# Patient Record
Sex: Female | Born: 1995 | Race: Black or African American | Hispanic: No | Marital: Married | Smoking: Never smoker
Health system: Southern US, Community
[De-identification: ages and names within clinical notes are randomized; demographics above are authoritative.]

## PROBLEM LIST (undated history)

## (undated) DIAGNOSIS — Z789 Other specified health status: Secondary | ICD-10-CM

## (undated) DIAGNOSIS — O1490 Unspecified pre-eclampsia, unspecified trimester: Secondary | ICD-10-CM

## (undated) DIAGNOSIS — I10 Essential (primary) hypertension: Secondary | ICD-10-CM

## (undated) DIAGNOSIS — B029 Zoster without complications: Secondary | ICD-10-CM

## (undated) HISTORY — PX: APPENDECTOMY: SHX54

## (undated) HISTORY — DX: Essential (primary) hypertension: I10

## (undated) HISTORY — DX: Zoster without complications: B02.9

---
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
## 2016-12-16 ENCOUNTER — Encounter (HOSPITAL_COMMUNITY): Payer: Self-pay | Admitting: Emergency Medicine

## 2016-12-16 DIAGNOSIS — N6452 Nipple discharge: Secondary | ICD-10-CM | POA: Insufficient documentation

## 2016-12-16 DIAGNOSIS — Z87891 Personal history of nicotine dependence: Secondary | ICD-10-CM | POA: Insufficient documentation

## 2016-12-16 NOTE — ED Triage Notes (Signed)
Pt st's she woke up this am with soreness to right breast.  St's tonight while at work she had bleeding coming from right nipple.

## 2016-12-17 ENCOUNTER — Emergency Department (HOSPITAL_COMMUNITY)
Admission: EM | Admit: 2016-12-17 | Discharge: 2016-12-17 | Disposition: A | Discharge status: Home / Self Care | Payer: Self-pay | Attending: Emergency Medicine | Admitting: Emergency Medicine

## 2016-12-17 ENCOUNTER — Encounter: Payer: Self-pay | Admitting: Emergency Medicine

## 2016-12-17 DIAGNOSIS — N6459 Other signs and symptoms in breast: Secondary | ICD-10-CM

## 2016-12-17 LAB — POC URINE PREG, ED: Preg Test, Ur: NEGATIVE

## 2016-12-17 MED ORDER — IBUPROFEN 600 MG PO TABS: 600.0000 mg | ORAL_TABLET | Freq: Four times a day (QID) | ORAL | 0 refills | Status: DC | PRN

## 2016-12-17 MED ORDER — BACITRACIN ZINC 500 UNIT/GM EX OINT: 1.0000 "application " | TOPICAL_OINTMENT | Freq: Two times a day (BID) | CUTANEOUS | 0 refills | Status: DC

## 2016-12-17 NOTE — ED Provider Notes (Signed)
[facility] Provider Note   CSN: [id number] Arrival date & time: 12/16/16  2349    History   Chief Complaint Chief Complaint  Patient presents with  . Breast Problem    HPI Erika Christian is a 21 y.o. female.  21 year old female presents to the emergency department for evaluation of right breast soreness. She states that soreness began on waking this morning. It is worse with palpation to the area. She has also had a small amount of bleeding from her nipple. She denies any purulent drainage. No soreness anywhere else on the breast. Patient denies any recent trauma. No new soaps or lotions. No associated fever or history of breast abscess. No medications taken prior to arrival for symptoms.     History reviewed. No pertinent past medical history.  There are no active problems to display for this patient.   Past Surgical History:  Procedure Laterality Date  . APPENDECTOMY      OB History    No data available       Home Medications    Prior to Admission medications   Medication Sig Start Date End Date Taking? Authorizing Provider  bacitracin ointment Apply 1 application topically 2 (two) times daily. 12/17/16   Antony MaduraKelly Shaddai Shapley, PA-C  ibuprofen (ADVIL,MOTRIN) 600 MG tablet Take 1 tablet (600 mg total) by mouth every 6 (six) hours as needed. 12/17/16   Antony MaduraKelly Dinero Chavira, PA-C    Family History No family history on file.  Social History Social History  Substance Use Topics  . Smoking status: Former Games developermoker  . Smokeless tobacco: Never Used  . Alcohol use Yes     Allergies   Patient has no allergy information on record.   Review of Systems Review of Systems Ten systems reviewed and are negative for acute change, except as noted in the HPI.    Physical Exam Updated Vital Signs BP 113/72   Pulse 72   Temp 98.6 F (37 C) (Oral)   Resp 16   Ht 4\' 8"  (1.422 m)   Wt 76.2 kg   LMP 12/09/2016 (Exact Date)   SpO2 100%   BMI 37.66 kg/m   Physical Exam    Constitutional: She is oriented to person, place, and time. She appears well-developed and well-nourished. No distress.  Nontoxic and in NAD  HENT:  Head: Normocephalic and atraumatic.  Eyes: Conjunctivae and EOM are normal. No scleral icterus.  Neck: Normal range of motion.  Cardiovascular: Normal rate, regular rhythm and intact distal pulses.   Pulmonary/Chest: Effort normal. No respiratory distress.  Respirations even and unlabored. There is soreness to the nipple and areola. Skin appears cracked with scant bleeding. No blood expelled from nipple with palpation. No underlying induration. No erythema or heat to touch. No other associated breast tenderness. No palpable masses.  Musculoskeletal: Normal range of motion.  Neurological: She is alert and oriented to person, place, and time. She exhibits normal muscle tone. Coordination normal.  Skin: Skin is warm and dry. No rash noted. She is not diaphoretic. No erythema. No pallor.  Psychiatric: She has a normal mood and affect. Her behavior is normal.  Nursing note and vitals reviewed.    ED Treatments / Results  Labs (all labs ordered are listed, but only abnormal results are displayed) Labs Reviewed  POC URINE PREG, ED    EKG  EKG Interpretation None       Radiology No results found.  Procedures Procedures (including critical care time)  Medications Ordered in ED Medications -  No data to display   Initial Impression / Assessment and Plan / ED Course  I have reviewed the triage vital signs and the nursing notes.  Pertinent labs & imaging results that were available during my care of the patient were reviewed by me and considered in my medical decision making (see chart for details).     21 year old female presents to the emergency department for evaluation of bleeding from her right foot. There appears to be cracking in the skin of the right nipple. No associated induration, though the area is sore to touch. Not able  to expel any blood from the area. No evidence of mastitis or abscess. Pregnancy negative. Plan to manage supportively and refer to general surgery who can send the [facility] if [facility]. I have also recommended OB/GYN follow-up. Return precautions discussed and provided. Patient discharged in stable condition with no unaddressed concerns.   Final Clinical Impressions(s) / ED Diagnoses   Final diagnoses:  Bleeding from nipple in female    New Prescriptions Discharge Medication List as of 12/17/2016  4:15 AM    START taking these medications   Details  bacitracin ointment Apply 1 application topically 2 (two) times daily., Starting Sun 12/17/2016, Print    ibuprofen (ADVIL,MOTRIN) 600 MG tablet Take 1 tablet (600 mg total) by mouth every 6 (six) hours as needed., Starting Sun 12/17/2016, Print         [place], PA-C 12/17/16 1610    Gilda Crease, MD 12/17/16 [phone]

## 2016-12-17 NOTE — Discharge Instructions (Signed)
Use bacitracin to the area once per day. You may take ibuprofen for pain. Keep the area covered with a bandage or gauze. [facility] and/or your OBGYN.

## 2017-01-17 ENCOUNTER — Other Ambulatory Visit (HOSPITAL_COMMUNITY): Payer: Self-pay | Admitting: *Deleted

## 2017-01-17 DIAGNOSIS — N6452 Nipple discharge: Secondary | ICD-10-CM

## 2017-01-18 ENCOUNTER — Other Ambulatory Visit: Payer: Medicaid Other

## 2017-01-18 ENCOUNTER — Ambulatory Visit (HOSPITAL_COMMUNITY): Payer: Medicaid Other

## 2017-02-17 ENCOUNTER — Ambulatory Visit (HOSPITAL_COMMUNITY)
Admission: EM | Admit: 2017-02-17 | Discharge: 2017-02-17 | Disposition: A | Discharge status: Home / Self Care | Payer: Self-pay | Attending: Family Medicine | Admitting: Family Medicine

## 2017-02-17 ENCOUNTER — Encounter (HOSPITAL_COMMUNITY): Payer: Self-pay | Admitting: *Deleted

## 2017-02-17 ENCOUNTER — Ambulatory Visit (INDEPENDENT_AMBULATORY_CARE_PROVIDER_SITE_OTHER): Payer: Self-pay

## 2017-02-17 DIAGNOSIS — R0600 Dyspnea, unspecified: Secondary | ICD-10-CM

## 2017-02-17 DIAGNOSIS — J3489 Other specified disorders of nose and nasal sinuses: Secondary | ICD-10-CM

## 2017-02-17 DIAGNOSIS — R0981 Nasal congestion: Secondary | ICD-10-CM

## 2017-02-17 DIAGNOSIS — R0602 Shortness of breath: Secondary | ICD-10-CM

## 2017-02-17 MED ORDER — ALBUTEROL SULFATE HFA 108 (90 BASE) MCG/ACT IN AERS: 2.0000 | INHALATION_SPRAY | RESPIRATORY_TRACT | 0 refills | Status: DC | PRN

## 2017-02-17 NOTE — ED Notes (Signed)
Starting HR 87 and O2 100% room air. PT ambulates down long hallway and back. Highest HR was 102 bpm and O2 sats stayed 99-100% on room air. On return to room PT reports she feels like she is not breathing well, her chest is uncomfortable. No obvious respiratory distress. Respiratory rate 16-20. Hayden Rasmussen, NP made aware.

## 2017-02-17 NOTE — ED Triage Notes (Signed)
C/O chills and congestion x approx 2 days.  Last night started to feel like "I can't breathe good" through nose nor mouth.  Denies asthma hx.  Denies any pain.

## 2017-02-17 NOTE — Discharge Instructions (Signed)
Physical exam was normal. Chest x-ray was normal. Vital signs and oxygen saturation were normal. It is not completely clear as to what is causing you to feel short of breath. Unable to hear lung sounds. Well due to not taking a deep breath that it is possible that he may have occult bronchospasm. We will prescribe an albuterol inhaler to try to see if that will help. For any worsening, new symptoms or problems recommend following up with your doctor or go to the emergency department.

## 2017-02-17 NOTE — ED Provider Notes (Signed)
CSN: [id number] Arrival date & time 02/17/17  1451 History   First MD Initiated Contact with Patient 02/17/17 1610     Chief Complaint  Patient presents with  . Shortness of Breath  . Chills   (Consider location/radiation/quality/duration/timing/severity/associated sxs/prior Treatment) 21 year old female states that she is having trouble breathing through her nose sometimes but not now, stuffy nose and probable breathing through her mouth. She feels like she cannot get enough air in her lungs. She may have a little sore throat right middle associated with PND but no cough. No fever.      History reviewed. No pertinent past medical history. Past Surgical History:  Procedure Laterality Date  . APPENDECTOMY     No family history on file. Social History  Substance Use Topics  . Smoking status: Never Smoker  . Smokeless tobacco: Never Used  . Alcohol use Yes     Comment: occasionally   OB History    No data available     Review of Systems  Constitutional: Positive for activity change and chills. Negative for fever.  HENT: Positive for congestion and sore throat.   Respiratory: Positive for shortness of breath. Negative for wheezing.   Cardiovascular: Positive for chest pain.  Gastrointestinal: Negative.   Neurological: Negative.   All other systems reviewed and are negative.   Allergies  Orange fruit [citrus]  Home Medications   Prior to Admission medications   Medication Sig Start Date End Date Taking? Authorizing Provider  albuterol (PROVENTIL HFA;VENTOLIN HFA) 108 (90 Base) MCG/ACT inhaler Inhale 2 puffs into the lungs every 4 (four) hours as needed for wheezing or shortness of breath. 02/17/17   Hayden Rasmussen, NP   Meds Ordered and Administered this Visit  Medications - No data to display  BP 129/84 (BP Location: Left Arm)   Pulse 98   Temp 98.6 F (37 C) (Oral)   Resp 14   LMP 02/04/2017 (Approximate)   SpO2 100%  No data found.   Physical Exam   Constitutional: She is oriented to person, place, and time. She appears well-developed and well-nourished. No distress.  Patient is sitting on the exam table smiling and talking and shows no signs of distress whatsoever. Able to speak in complete sentences without having to stop and take extra breaths. She has relaxed posturing. Breathing is even and nonlabored.  HENT:  Head: Normocephalic and atraumatic.  Bilateral TMs are mildly retracted. Oropharynx with minimal erythema and scant clear PND. Otherwise no swelling, no exudates or other abnormalities.  Eyes: Conjunctivae and EOM are normal.  Neck: Normal range of motion. Neck supple. No tracheal deviation present.  No masses or  swelling.  Cardiovascular: Normal rate, regular rhythm, normal heart sounds and intact distal pulses.   Pulmonary/Chest: Effort normal and breath sounds normal. No respiratory distress. She has no wheezes. She has no rales. She exhibits tenderness.  Patient able to breathe in and out rapidly, although poor effort and unable/unwilling to take deep breaths.  Lungs are clear. No stridor or abnormal sounds from the throat or chest.  Positive for chest wall pain and tenderness over the right upper sternal border. This reproduces the pain for which she complains when taking a deep breath.  Abdominal: Soft. There is no tenderness. There is no rebound.  Musculoskeletal: Normal range of motion. She exhibits no edema or tenderness.  Lymphadenopathy:    She has no cervical adenopathy.  Neurological: She is alert and oriented to person, place, and time. No  cranial nerve deficit.  Skin: Skin is warm and dry. No rash noted.  Nursing note and vitals reviewed.   Urgent Care Course     Procedures (including critical care time)  Labs Review Labs Reviewed - No data to display  Imaging Review Dg Chest 2 View  Result Date: 02/17/2017 CLINICAL DATA:  Per pt: SOB and sick for two days, chest pain. Patient pointed to the mid  sternum for pain, no injury, no fever. Non-smoker. No HBP. No history of cardiac disease or respiratory disease. Patient is not a diabetic EXAM: CHEST  2 VIEW COMPARISON:  None. FINDINGS: Midline trachea.  Normal heart size and mediastinal contours. Sharp costophrenic angles.  No pneumothorax.  Clear lungs. IMPRESSION: No active cardiopulmonary disease. Electronically Signed   By: Jeronimo Greaves M.D.   On: 02/17/2017 17:18     Visual Acuity Review  Right Eye Distance:   Left Eye Distance:   Bilateral Distance:    Right Eye Near:   Left Eye Near:    Bilateral Near:         MDM   1. Dyspnea, unspecified type   2. Nasal congestion with rhinorrhea     21 year old female with subjective dyspnea more so on exertion. Her exam is completely normal, with the exception of PND. Resting O2 sat 100%, walking down the hall and back 99-100%, pulse rate did go up from 87 to 100. Lungs are clear. No objective evidence of difficulty with breathing or shortness of breath. Chest x-ray is clear. No evidence of infection on x-ray. Suspect there may be a psychological component involved. There may be an occult bronchospasm that is not her on lung auscultation as the patient was unable or unwilling to take a deep breath. Physical exam was normal. Chest x-ray was normal. Vital signs and oxygen saturation were normal. It is not completely clear as to what is causing you to feel short of breath. Unable to hear lung sounds. Well due to not taking a deep breath that it is possible that he may have occult bronchospasm. We will prescribe an albuterol inhaler to try to see if that will help. For any worsening, new symptoms or problems recommend following up with your doctor or go to the emergency department. Discussed non sedating antihistamine.  Meds ordered this encounter  Medications  . albuterol (PROVENTIL HFA;VENTOLIN HFA) 108 (90 Base) MCG/ACT inhaler    Sig: Inhale 2 puffs into the lungs every 4 (four) hours as  needed for wheezing or shortness of breath.    Dispense:  1 Inhaler    Refill:  0    Order Specific Question:   Supervising Provider    Answer:   Lonia Blood       Hayden Rasmussen, NP 02/17/17 1755    Hayden Rasmussen, NP 02/17/17 1756

## 2018-02-26 ENCOUNTER — Encounter (HOSPITAL_COMMUNITY): Payer: Self-pay | Admitting: Emergency Medicine

## 2018-02-26 ENCOUNTER — Other Ambulatory Visit: Payer: Self-pay

## 2018-02-26 ENCOUNTER — Ambulatory Visit (HOSPITAL_COMMUNITY)
Admission: EM | Admit: 2018-02-26 | Discharge: 2018-02-26 | Disposition: A | Discharge status: Home / Self Care | Payer: Self-pay | Attending: Family Medicine | Admitting: Family Medicine

## 2018-02-26 DIAGNOSIS — R059 Cough, unspecified: Secondary | ICD-10-CM

## 2018-02-26 DIAGNOSIS — H66001 Acute suppurative otitis media without spontaneous rupture of ear drum, right ear: Secondary | ICD-10-CM

## 2018-02-26 DIAGNOSIS — R05 Cough: Secondary | ICD-10-CM

## 2018-02-26 MED ORDER — MELOXICAM 7.5 MG PO TABS: 7.5000 mg | ORAL_TABLET | Freq: Every day | ORAL | 0 refills | Status: DC

## 2018-02-26 MED ORDER — AMOXICILLIN-POT CLAVULANATE 875-125 MG PO TABS: 1.0000 | ORAL_TABLET | Freq: Two times a day (BID) | ORAL | 0 refills | Status: DC

## 2018-02-26 MED ORDER — ALBUTEROL SULFATE HFA 108 (90 BASE) MCG/ACT IN AERS: 2.0000 | INHALATION_SPRAY | RESPIRATORY_TRACT | 0 refills | Status: DC | PRN

## 2018-02-26 MED ORDER — HYDROCOD POLST-CPM POLST ER 10-8 MG/5ML PO SUER: 5.0000 mL | Freq: Every evening | ORAL | 0 refills | Status: DC | PRN

## 2018-02-26 MED ORDER — IPRATROPIUM BROMIDE 0.06 % NA SOLN: 2.0000 | Freq: Four times a day (QID) | NASAL | 0 refills | Status: DC

## 2018-02-26 MED ORDER — BENZONATATE 100 MG PO CAPS: 100.0000 mg | ORAL_CAPSULE | Freq: Three times a day (TID) | ORAL | 0 refills | Status: DC

## 2018-02-26 NOTE — ED Triage Notes (Signed)
C/o productive cough head and chest congestion, rhinitis and chills onset Friday

## 2018-02-26 NOTE — ED Provider Notes (Signed)
[facility]    CSN: [id number] Arrival date & time: 02/26/18  1031     History   Chief Complaint Chief Complaint  Patient presents with  . Cough    HPI Erika Christian is a 22 y.o. female.   22 year old female comes in for 5-day history of URI symptoms. Nasal congestion, rhinorrhea, productive cough, sore throat, right ear pain. Has had body aches and chills. Denies fevers. States without history of asthma, but has needed albuterol in the past, and has had times when she felt she could have used one. States has had painful coughing to the chest otc zyrtec, benadryl, sinus medicine without relief. Never smoker.      History reviewed. No pertinent past medical history.  There are no active problems to display for this patient.   Past Surgical History:  Procedure Laterality Date  . APPENDECTOMY      OB History   None      Home Medications    Prior to Admission medications   Medication Sig Start Date End Date Taking? Authorizing Provider  diphenhydramine-acetaminophen (TYLENOL PM) 25-500 MG TABS tablet Take 1 tablet by mouth at bedtime as needed.   Yes [provider]  albuterol (PROVENTIL HFA;VENTOLIN HFA) 108 (90 Base) MCG/ACT inhaler Inhale 2 puffs into the lungs every 4 (four) hours as needed for wheezing or shortness of breath. 02/26/18   Cathie Hoops, Derrich Gaby V, PA-C  amoxicillin-clavulanate (AUGMENTIN) 875-125 MG tablet Take 1 tablet by mouth every 12 (twelve) hours. 02/26/18   Cathie Hoops, Jonquil Stubbe V, PA-C  benzonatate (TESSALON) 100 MG capsule Take 1 capsule (100 mg total) by mouth every 8 (eight) hours. 02/26/18   Cathie Hoops, Lynnann Knudsen V, PA-C  chlorpheniramine-HYDROcodone (TUSSIONEX PENNKINETIC ER) 10-8 MG/5ML SUER Take 5 mLs by mouth at bedtime as needed for cough. 02/26/18   Cathie Hoops, Isidora Laham V, PA-C  ipratropium (ATROVENT) 0.06 % nasal spray Place 2 sprays into both nostrils 4 (four) times daily. 02/26/18   Cathie Hoops, Barbarajean Kinzler V, PA-C  meloxicam (MOBIC) 7.5 MG tablet Take 1 tablet (7.5 mg total) by  mouth daily. 02/26/18   Belinda Fisher, PA-C    Family History History reviewed. No pertinent family history.  Social History Social History   Tobacco Use  . Smoking status: Never Smoker  . Smokeless tobacco: Never Used  Substance Use Topics  . Alcohol use: Yes    Comment: occasionally  . Drug use: No     Allergies   Orange fruit [citrus]   Review of Systems Review of Systems  Reason unable to perform ROS: See HPI as above.     Physical Exam Triage Vital Signs ED Triage Vitals  Enc Vitals Group     BP 02/26/18 1141 128/76     Pulse Rate 02/26/18 1141 90     Resp --      Temp 02/26/18 1141 98.1 F (36.7 C)     Temp Source 02/26/18 1141 Oral     SpO2 02/26/18 1141 100 %     Weight --      Height --      Head Circumference --      Peak Flow --      Pain Score 02/26/18 1138 9     Pain Loc --      Pain Edu? --      Excl. in GC? --    No data found.  Updated Vital Signs BP 128/76 (BP Location: Left Arm)   Pulse 90   Temp 98.1 F (36.7  C) (Oral)   SpO2 100%   Physical Exam  Constitutional: She is oriented to person, place, and time. She appears well-developed and well-nourished. No distress.  HENT:  Head: Normocephalic and atraumatic.  Right Ear: External ear and ear canal normal. Tympanic membrane is erythematous and bulging.  Left Ear: Tympanic membrane, external ear and ear canal normal. Tympanic membrane is not erythematous and not bulging.  Nose: Nose normal. Right sinus exhibits no maxillary sinus tenderness and no frontal sinus tenderness. Left sinus exhibits no maxillary sinus tenderness and no frontal sinus tenderness.  Mouth/Throat: Uvula is midline, oropharynx is clear and moist and mucous membranes are normal.  Eyes: Pupils are equal, round, and reactive to light. Conjunctivae are normal.  Neck: Normal range of motion. Neck supple.  Cardiovascular: Normal rate, regular rhythm and normal heart sounds. Exam reveals no gallop and no friction rub.  No  murmur heard. Pulmonary/Chest: Effort normal and breath sounds normal. No stridor. No respiratory distress. She has no decreased breath sounds. She has no wheezes. She has no rhonchi. She has no rales. She exhibits tenderness (diffuse).  Lymphadenopathy:    She has no cervical adenopathy.  Neurological: She is alert and oriented to person, place, and time.  Skin: Skin is warm and dry.  Psychiatric: She has a normal mood and affect. Her behavior is normal. Judgment normal.     UC Treatments / Results  Labs (all labs ordered are listed, but only abnormal results are displayed) Labs Reviewed - No data to display  EKG None Radiology No results found.  Procedures Procedures (including critical care time)  Medications Ordered in UC Medications - No data to display   Initial Impression / Assessment and Plan / UC Course  I have reviewed the triage vital signs and the nursing notes.  Pertinent labs & imaging results that were available during my care of the patient were reviewed by me and considered in my medical decision making (see chart for details).    Start augmentin for otitis media. Other symptomatic treatment discussed. Push fluids. Return precautions given. Patient expresses understanding and agrees to plan.   Final Clinical Impressions(s) / UC Diagnoses   Final diagnoses:  Cough  Non-recurrent acute suppurative otitis media of right ear without spontaneous rupture of tympanic membrane    ED Discharge Orders        Ordered    albuterol (PROVENTIL HFA;VENTOLIN HFA) 108 (90 Base) MCG/ACT inhaler  Every 4 hours PRN     02/26/18 1204    benzonatate (TESSALON) 100 MG capsule  Every 8 hours     02/26/18 1204    amoxicillin-clavulanate (AUGMENTIN) 875-125 MG tablet  Every 12 hours     02/26/18 1204    chlorpheniramine-HYDROcodone (TUSSIONEX PENNKINETIC ER) 10-8 MG/5ML SUER  At bedtime PRN     02/26/18 1204    meloxicam (MOBIC) 7.5 MG tablet  Daily     02/26/18 1204     ipratropium (ATROVENT) 0.06 % nasal spray  4 times daily     02/26/18 1204       Controlled Substance Prescriptions [place] Controlled Substance Registry consulted? Yes, I have consulted the  Controlled Substances Registry for this patient, and feel the risk/benefit ratio today is favorable for proceeding with this prescription for a controlled substance.   Belinda FisherYu, Shannell Mikkelsen V, PA-C 02/26/18 1219

## 2018-02-26 NOTE — Discharge Instructions (Signed)
Start Augmentin for ear infection. Tessalon for cough. Tussionex at night for cough. Mobic to help with the chest pain/muscle pain. Start atrovent nasal spray, zyrtec for nasal congestion/drainage. You can use over the counter nasal saline rinse such as neti pot for nasal congestion. Keep hydrated, your urine should be clear to pale yellow in color. Tylenol/motrin for fever and pain. Monitor for any worsening of symptoms, chest pain, shortness of breath, wheezing, swelling of the throat, follow up for reevaluation.   For sore throat/cough try using a honey-based tea. Use 3 teaspoons of honey with juice squeezed from half lemon. Place shaved pieces of ginger into 1/2-1 cup of water and warm over stove top. Then mix the ingredients and repeat every 4 hours as needed.

## 2018-10-25 ENCOUNTER — Encounter: Payer: Self-pay | Admitting: General Practice

## 2018-11-06 NOTE — L&D Delivery Note (Signed)
Delivery Note At 1444 a viable female infant was delivered via SVD, presentation: OA. APGAR: 8, 9; weight pending.   Placenta status: spontaneously delivered intact with gentle cord traction. Fundus firm with massage and Pitocin.   Anesthesia: epidural and local Lacerations: 2nd degree perineal, left labial Suture used for repair: 2-0, 3-0 Vicryl rapide Est. Blood Loss (mL): 197 Placenta to LD Complications n/a Cord ph n/a   Mom to postpartum. Baby to Couplet care / Skin to Skin.    Julianne Handler, CNM 05/22/2019 3:11 PM

## 2018-11-14 DIAGNOSIS — Z3402 Encounter for supervision of normal first pregnancy, second trimester: Secondary | ICD-10-CM | POA: Diagnosis not present

## 2018-11-14 DIAGNOSIS — Z3009 Encounter for other general counseling and advice on contraception: Secondary | ICD-10-CM | POA: Diagnosis not present

## 2018-11-14 DIAGNOSIS — Z1388 Encounter for screening for disorder due to exposure to contaminants: Secondary | ICD-10-CM | POA: Diagnosis not present

## 2018-11-14 DIAGNOSIS — Z0389 Encounter for observation for other suspected diseases and conditions ruled out: Secondary | ICD-10-CM | POA: Diagnosis not present

## 2018-11-14 LAB — OB RESULTS CONSOLE RUBELLA ANTIBODY, IGM: Rubella: NON-IMMUNE/NOT IMMUNE

## 2018-11-14 LAB — OB RESULTS CONSOLE GC/CHLAMYDIA
Chlamydia: NEGATIVE
Gonorrhea: NEGATIVE

## 2018-11-14 LAB — OB RESULTS CONSOLE HEPATITIS B SURFACE ANTIGEN: Hepatitis B Surface Ag: NEGATIVE

## 2018-11-14 LAB — OB RESULTS CONSOLE ABO/RH: RH Type: POSITIVE

## 2018-11-14 LAB — OB RESULTS CONSOLE RPR: RPR: NONREACTIVE

## 2018-11-14 LAB — OB RESULTS CONSOLE HIV ANTIBODY (ROUTINE TESTING): HIV: NONREACTIVE

## 2018-12-12 DIAGNOSIS — N390 Urinary tract infection, site not specified: Secondary | ICD-10-CM | POA: Diagnosis not present

## 2018-12-12 DIAGNOSIS — E669 Obesity, unspecified: Secondary | ICD-10-CM | POA: Diagnosis not present

## 2018-12-12 DIAGNOSIS — Z3A18 18 weeks gestation of pregnancy: Secondary | ICD-10-CM | POA: Diagnosis not present

## 2018-12-12 DIAGNOSIS — Z3402 Encounter for supervision of normal first pregnancy, second trimester: Secondary | ICD-10-CM | POA: Diagnosis not present

## 2018-12-12 DIAGNOSIS — Z789 Other specified health status: Secondary | ICD-10-CM | POA: Diagnosis not present

## 2018-12-12 DIAGNOSIS — Z9889 Other specified postprocedural states: Secondary | ICD-10-CM | POA: Diagnosis not present

## 2018-12-12 DIAGNOSIS — O281 Abnormal biochemical finding on antenatal screening of mother: Secondary | ICD-10-CM | POA: Diagnosis not present

## 2018-12-12 DIAGNOSIS — R823 Hemoglobinuria: Secondary | ICD-10-CM | POA: Diagnosis not present

## 2018-12-23 DIAGNOSIS — O3680X0 Pregnancy with inconclusive fetal viability, not applicable or unspecified: Secondary | ICD-10-CM | POA: Diagnosis not present

## 2019-01-20 DIAGNOSIS — O99212 Obesity complicating pregnancy, second trimester: Secondary | ICD-10-CM | POA: Diagnosis not present

## 2019-02-24 DIAGNOSIS — N390 Urinary tract infection, site not specified: Secondary | ICD-10-CM | POA: Diagnosis not present

## 2019-02-24 DIAGNOSIS — Z3402 Encounter for supervision of normal first pregnancy, second trimester: Secondary | ICD-10-CM | POA: Diagnosis not present

## 2019-02-24 DIAGNOSIS — R823 Hemoglobinuria: Secondary | ICD-10-CM | POA: Diagnosis not present

## 2019-02-24 DIAGNOSIS — E669 Obesity, unspecified: Secondary | ICD-10-CM | POA: Diagnosis not present

## 2019-02-24 DIAGNOSIS — Z789 Other specified health status: Secondary | ICD-10-CM | POA: Diagnosis not present

## 2019-02-24 DIAGNOSIS — Z9889 Other specified postprocedural states: Secondary | ICD-10-CM | POA: Diagnosis not present

## 2019-02-24 DIAGNOSIS — O358XX0 Maternal care for other (suspected) fetal abnormality and damage, not applicable or unspecified: Secondary | ICD-10-CM | POA: Diagnosis not present

## 2019-03-13 DIAGNOSIS — Z9889 Other specified postprocedural states: Secondary | ICD-10-CM | POA: Diagnosis not present

## 2019-03-13 DIAGNOSIS — Z789 Other specified health status: Secondary | ICD-10-CM | POA: Diagnosis not present

## 2019-03-13 DIAGNOSIS — R823 Hemoglobinuria: Secondary | ICD-10-CM | POA: Diagnosis not present

## 2019-03-13 DIAGNOSIS — N390 Urinary tract infection, site not specified: Secondary | ICD-10-CM | POA: Diagnosis not present

## 2019-03-13 DIAGNOSIS — E669 Obesity, unspecified: Secondary | ICD-10-CM | POA: Diagnosis not present

## 2019-03-13 DIAGNOSIS — Z3403 Encounter for supervision of normal first pregnancy, third trimester: Secondary | ICD-10-CM | POA: Diagnosis not present

## 2019-03-26 DIAGNOSIS — N390 Urinary tract infection, site not specified: Secondary | ICD-10-CM | POA: Diagnosis not present

## 2019-03-26 DIAGNOSIS — Z789 Other specified health status: Secondary | ICD-10-CM | POA: Diagnosis not present

## 2019-03-26 DIAGNOSIS — R823 Hemoglobinuria: Secondary | ICD-10-CM | POA: Diagnosis not present

## 2019-03-26 DIAGNOSIS — E669 Obesity, unspecified: Secondary | ICD-10-CM | POA: Diagnosis not present

## 2019-03-26 DIAGNOSIS — Z3403 Encounter for supervision of normal first pregnancy, third trimester: Secondary | ICD-10-CM | POA: Diagnosis not present

## 2019-03-26 DIAGNOSIS — Z9889 Other specified postprocedural states: Secondary | ICD-10-CM | POA: Diagnosis not present

## 2019-04-08 ENCOUNTER — Emergency Department (HOSPITAL_COMMUNITY)
Admission: EM | Admit: 2019-04-08 | Discharge: 2019-04-08 | Disposition: A | Discharge status: Home / Self Care | Payer: Medicaid Other | Attending: Emergency Medicine | Admitting: Emergency Medicine

## 2019-04-08 ENCOUNTER — Other Ambulatory Visit: Payer: Self-pay

## 2019-04-08 DIAGNOSIS — O9989 Other specified diseases and conditions complicating pregnancy, childbirth and the puerperium: Secondary | ICD-10-CM | POA: Diagnosis not present

## 2019-04-08 DIAGNOSIS — Z3A35 35 weeks gestation of pregnancy: Secondary | ICD-10-CM | POA: Insufficient documentation

## 2019-04-08 DIAGNOSIS — M79671 Pain in right foot: Secondary | ICD-10-CM | POA: Diagnosis not present

## 2019-04-08 DIAGNOSIS — Z79899 Other long term (current) drug therapy: Secondary | ICD-10-CM | POA: Insufficient documentation

## 2019-04-08 NOTE — Progress Notes (Signed)
RROB presents to ED with complaints of swelling and pain in right foot.  Pt reports being "7 months" pregnant.  Dr Adrian Blackwater would like NST on pt.

## 2019-04-08 NOTE — ED Notes (Signed)
Called [facility]. Gave the patient's OB information, Dr. Laveda Norman [phone].

## 2019-04-08 NOTE — Progress Notes (Signed)
Dr Adrian Blackwater shown fhr tracing and questionable decel at 1240.  Md reviews and says he does not believe to be a decel.

## 2019-04-08 NOTE — Progress Notes (Signed)
Dr Adrian Blackwater notified that fhr tracing has been reactive and reassuring.  Also made aware that pt has been cleared by ED.  Dr Adrian Blackwater says pt may be cleared obstetrically.

## 2019-04-08 NOTE — ED Notes (Signed)
Patient verbalizes understanding of discharge instructions. Opportunity for questioning and answers were provided. Armband removed by staff, pt discharged from ED. Ambulated out to lobby  

## 2019-04-08 NOTE — Discharge Instructions (Addendum)
Please read attached information. If you experience any new or worsening signs or symptoms please return to the emergency room for evaluation. Please follow-up with your primary care provider or specialist as discussed.  °

## 2019-04-08 NOTE — ED Provider Notes (Signed)
[facility] Provider Note   CSN: [id number] Arrival date & time: 04/08/19  1116    History   Chief Complaint Chief Complaint  Patient presents with  . Foot Pain    HPI Erika Christian is a 23 y.o. female.     HPI   6835 week pregnant female presents today with with complaints of right foot pain.  He notes symptoms started 3 days ago with pain in the bottom of her right foot.  She notes pain with palpation and forced extension at the right great toe.  She notes intermittent subjective edema in her foot, none presently.  She denies any pain in her calf, no swelling to the remainder of the lower extremity.  She denies any trauma.  She called her OB/GYN at the health department who recommended she come to the emergency room for evaluation.  No vaginal bleeding or discharge.  No abdominal pain.  She does note that she has a follow-up in 2 days with her OB/GYN.  No past medical history on file.  There are no active problems to display for this patient.   Past Surgical History:  Procedure Laterality Date  . APPENDECTOMY       OB History   No obstetric history on file.      Home Medications    Prior to Admission medications   Medication Sig Start Date End Date Taking? Authorizing Provider  albuterol (PROVENTIL HFA;VENTOLIN HFA) 108 (90 Base) MCG/ACT inhaler Inhale 2 puffs into the lungs every 4 (four) hours as needed for wheezing or shortness of breath. 02/26/18   Cathie HoopsYu, Amy V, PA-C  amoxicillin-clavulanate (AUGMENTIN) 875-125 MG tablet Take 1 tablet by mouth every 12 (twelve) hours. 02/26/18   Cathie HoopsYu, Amy V, PA-C  benzonatate (TESSALON) 100 MG capsule Take 1 capsule (100 mg total) by mouth every 8 (eight) hours. 02/26/18   Cathie HoopsYu, Amy V, PA-C  chlorpheniramine-HYDROcodone (TUSSIONEX PENNKINETIC ER) 10-8 MG/5ML SUER Take 5 mLs by mouth at bedtime as needed for cough. 02/26/18   Cathie HoopsYu, Amy V, PA-C  diphenhydramine-acetaminophen (TYLENOL PM) 25-500 MG TABS  tablet Take 1 tablet by mouth at bedtime as needed.    [provider]  ipratropium (ATROVENT) 0.06 % nasal spray Place 2 sprays into both nostrils 4 (four) times daily. 02/26/18   Cathie HoopsYu, Amy V, PA-C  meloxicam (MOBIC) 7.5 MG tablet Take 1 tablet (7.5 mg total) by mouth daily. 02/26/18   Belinda FisherYu, Amy V, PA-C    Family History No family history on file.  Social History Social History   Tobacco Use  . Smoking status: Never Smoker  . Smokeless tobacco: Never Used  Substance Use Topics  . Alcohol use: Yes    Comment: occasionally  . Drug use: No     Allergies   Orange fruit [citrus]   Review of Systems Review of Systems  All other systems reviewed and are negative.    Physical Exam Updated Vital Signs BP (!) 125/95 (BP Location: Right Arm)   Pulse (!) 105   Temp 98.6 F (37 C) (Oral)   Resp 14   Ht 4\' 8"  (1.422 m)   Wt 86.2 kg   SpO2 100%   BMI 42.60 kg/m   Physical Exam Vitals signs and nursing note reviewed.  Constitutional:      Appearance: She is well-developed.  HENT:     Head: Normocephalic and atraumatic.  Eyes:     General: No scleral icterus.  Right eye: No discharge.        Left eye: No discharge.     Conjunctiva/sclera: Conjunctivae normal.     Pupils: Pupils are equal, round, and reactive to light.  Neck:     Musculoskeletal: Normal range of motion.     Vascular: No JVD.     Trachea: No tracheal deviation.  Pulmonary:     Effort: Pulmonary effort is normal.     Breath sounds: No stridor.  Musculoskeletal:     Comments: Bilateral lower extremities without significant edema, they are symmetric-no redness noted, minimal tenderness palpation along the plantar aspect of the right great metatarsal, pain with forced extension of the great toe-no numbness with forced plantar dorsiflexion of the ankle, calf nontender thigh nontender  Neurological:     Mental Status: She is alert and oriented to person, place, and time.     Coordination:  Coordination normal.  Psychiatric:        Behavior: Behavior normal.        Thought Content: Thought content normal.        Judgment: Judgment normal.      ED Treatments / Results  Labs (all labs ordered are listed, but only abnormal results are displayed) Labs Reviewed - No data to display  EKG None  Radiology No results found.  Procedures Procedures (including critical care time)  Medications Ordered in ED Medications - No data to display   Initial Impression / Assessment and Plan / ED Course  I have reviewed the triage vital signs and the nursing notes.  Pertinent labs & imaging results that were available during my care of the patient were reviewed by me and considered in my medical decision making (see chart for details).        23 year old female presents today with pain in her foot.  She has no signs of trauma she has point tenderness likely overuse.  I have very low suspicion for DVT as she has no edema presently and has no complaints of significant peripheral edema.  Patient is [redacted] weeks pregnant she has no pregnancy related complaints, rapid OB was at bedside with no complications.  Patient encouraged follow-up as an outpatient if her symptoms persist, return precautions given.  She verbalized understanding and agreement to this plan.  Final Clinical Impressions(s) / ED Diagnoses   Final diagnoses:  Right foot pain    ED Discharge Orders    None       Rosalio Loud 04/08/19 1317    Benjiman Core, MD 04/08/19 [phone]

## 2019-04-08 NOTE — ED Triage Notes (Addendum)
Patient is complaining of right foot pain since Sunday. The pain is worse when she walks or drives. Patient is 7 months pregnant.

## 2019-04-10 DIAGNOSIS — Z789 Other specified health status: Secondary | ICD-10-CM | POA: Diagnosis not present

## 2019-04-10 DIAGNOSIS — Z9889 Other specified postprocedural states: Secondary | ICD-10-CM | POA: Diagnosis not present

## 2019-04-10 DIAGNOSIS — E669 Obesity, unspecified: Secondary | ICD-10-CM | POA: Diagnosis not present

## 2019-04-10 DIAGNOSIS — N390 Urinary tract infection, site not specified: Secondary | ICD-10-CM | POA: Diagnosis not present

## 2019-04-10 DIAGNOSIS — Z23 Encounter for immunization: Secondary | ICD-10-CM | POA: Diagnosis not present

## 2019-04-10 DIAGNOSIS — Z3403 Encounter for supervision of normal first pregnancy, third trimester: Secondary | ICD-10-CM | POA: Diagnosis not present

## 2019-04-10 DIAGNOSIS — R823 Hemoglobinuria: Secondary | ICD-10-CM | POA: Diagnosis not present

## 2019-04-24 DIAGNOSIS — E669 Obesity, unspecified: Secondary | ICD-10-CM | POA: Diagnosis not present

## 2019-04-24 DIAGNOSIS — R823 Hemoglobinuria: Secondary | ICD-10-CM | POA: Diagnosis not present

## 2019-04-24 DIAGNOSIS — Z789 Other specified health status: Secondary | ICD-10-CM | POA: Diagnosis not present

## 2019-04-24 DIAGNOSIS — Z23 Encounter for immunization: Secondary | ICD-10-CM | POA: Diagnosis not present

## 2019-04-24 DIAGNOSIS — Z3403 Encounter for supervision of normal first pregnancy, third trimester: Secondary | ICD-10-CM | POA: Diagnosis not present

## 2019-04-24 DIAGNOSIS — N390 Urinary tract infection, site not specified: Secondary | ICD-10-CM | POA: Diagnosis not present

## 2019-04-24 DIAGNOSIS — Z9889 Other specified postprocedural states: Secondary | ICD-10-CM | POA: Diagnosis not present

## 2019-05-08 DIAGNOSIS — Z9889 Other specified postprocedural states: Secondary | ICD-10-CM | POA: Diagnosis not present

## 2019-05-08 DIAGNOSIS — Z3403 Encounter for supervision of normal first pregnancy, third trimester: Secondary | ICD-10-CM | POA: Diagnosis not present

## 2019-05-08 DIAGNOSIS — N390 Urinary tract infection, site not specified: Secondary | ICD-10-CM | POA: Diagnosis not present

## 2019-05-08 DIAGNOSIS — Z789 Other specified health status: Secondary | ICD-10-CM | POA: Diagnosis not present

## 2019-05-08 DIAGNOSIS — R823 Hemoglobinuria: Secondary | ICD-10-CM | POA: Diagnosis not present

## 2019-05-08 DIAGNOSIS — Z23 Encounter for immunization: Secondary | ICD-10-CM | POA: Diagnosis not present

## 2019-05-08 DIAGNOSIS — E669 Obesity, unspecified: Secondary | ICD-10-CM | POA: Diagnosis not present

## 2019-05-14 DIAGNOSIS — N390 Urinary tract infection, site not specified: Secondary | ICD-10-CM | POA: Diagnosis not present

## 2019-05-14 DIAGNOSIS — Z23 Encounter for immunization: Secondary | ICD-10-CM | POA: Diagnosis not present

## 2019-05-14 DIAGNOSIS — R823 Hemoglobinuria: Secondary | ICD-10-CM | POA: Diagnosis not present

## 2019-05-14 DIAGNOSIS — Z9889 Other specified postprocedural states: Secondary | ICD-10-CM | POA: Diagnosis not present

## 2019-05-14 DIAGNOSIS — E669 Obesity, unspecified: Secondary | ICD-10-CM | POA: Diagnosis not present

## 2019-05-14 DIAGNOSIS — Z3403 Encounter for supervision of normal first pregnancy, third trimester: Secondary | ICD-10-CM | POA: Diagnosis not present

## 2019-05-14 DIAGNOSIS — Z789 Other specified health status: Secondary | ICD-10-CM | POA: Diagnosis not present

## 2019-05-21 ENCOUNTER — Encounter (HOSPITAL_COMMUNITY): Payer: Self-pay | Admitting: *Deleted

## 2019-05-21 ENCOUNTER — Other Ambulatory Visit: Payer: Self-pay

## 2019-05-21 ENCOUNTER — Inpatient Hospital Stay (HOSPITAL_COMMUNITY)
Admission: RE | Admit: 2019-05-21 | Discharge: 2019-05-24 | DRG: 807 | Disposition: A | Discharge status: Home / Self Care | Payer: Medicaid Other | Attending: Obstetrics and Gynecology | Admitting: Obstetrics and Gynecology

## 2019-05-21 DIAGNOSIS — Z3403 Encounter for supervision of normal first pregnancy, third trimester: Secondary | ICD-10-CM | POA: Diagnosis not present

## 2019-05-21 DIAGNOSIS — Z9889 Other specified postprocedural states: Secondary | ICD-10-CM | POA: Diagnosis not present

## 2019-05-21 DIAGNOSIS — Z789 Other specified health status: Secondary | ICD-10-CM | POA: Diagnosis not present

## 2019-05-21 DIAGNOSIS — O99214 Obesity complicating childbirth: Secondary | ICD-10-CM | POA: Diagnosis present

## 2019-05-21 DIAGNOSIS — Z3A38 38 weeks gestation of pregnancy: Secondary | ICD-10-CM | POA: Diagnosis not present

## 2019-05-21 DIAGNOSIS — O1414 Severe pre-eclampsia complicating childbirth: Secondary | ICD-10-CM | POA: Diagnosis not present

## 2019-05-21 DIAGNOSIS — N390 Urinary tract infection, site not specified: Secondary | ICD-10-CM | POA: Diagnosis not present

## 2019-05-21 DIAGNOSIS — O1213 Gestational proteinuria, third trimester: Secondary | ICD-10-CM | POA: Diagnosis not present

## 2019-05-21 DIAGNOSIS — O1490 Unspecified pre-eclampsia, unspecified trimester: Secondary | ICD-10-CM | POA: Diagnosis present

## 2019-05-21 DIAGNOSIS — R823 Hemoglobinuria: Secondary | ICD-10-CM | POA: Diagnosis not present

## 2019-05-21 DIAGNOSIS — Z1159 Encounter for screening for other viral diseases: Secondary | ICD-10-CM

## 2019-05-21 DIAGNOSIS — O1404 Mild to moderate pre-eclampsia, complicating childbirth: Secondary | ICD-10-CM | POA: Diagnosis not present

## 2019-05-21 DIAGNOSIS — Z23 Encounter for immunization: Secondary | ICD-10-CM | POA: Diagnosis not present

## 2019-05-21 DIAGNOSIS — E669 Obesity, unspecified: Secondary | ICD-10-CM | POA: Diagnosis not present

## 2019-05-21 HISTORY — DX: Other specified health status: Z78.9

## 2019-05-21 LAB — CBC WITH DIFFERENTIAL/PLATELET
Abs Immature Granulocytes: 0.12 10*3/uL — ABNORMAL HIGH (ref 0.00–0.07)
Basophils Absolute: 0 10*3/uL (ref 0.0–0.1)
Basophils Relative: 0 %
Eosinophils Absolute: 0.1 10*3/uL (ref 0.0–0.5)
Eosinophils Relative: 1 %
HCT: 36.6 % (ref 36.0–46.0)
Hemoglobin: 12.2 g/dL (ref 12.0–15.0)
Immature Granulocytes: 1 %
Lymphocytes Relative: 14 %
Lymphs Abs: 1.7 10*3/uL (ref 0.7–4.0)
MCH: 29.3 pg (ref 26.0–34.0)
MCHC: 33.3 g/dL (ref 30.0–36.0)
MCV: 88 fL (ref 80.0–100.0)
Monocytes Absolute: 0.8 10*3/uL (ref 0.1–1.0)
Monocytes Relative: 6 %
Neutro Abs: 10 10*3/uL — ABNORMAL HIGH (ref 1.7–7.7)
Neutrophils Relative %: 78 %
Platelets: 250 10*3/uL (ref 150–400)
RBC: 4.16 MIL/uL (ref 3.87–5.11)
RDW: 14.6 % (ref 11.5–15.5)
WBC: 12.8 10*3/uL — ABNORMAL HIGH (ref 4.0–10.5)
nRBC: 0 % (ref 0.0–0.2)

## 2019-05-21 LAB — PROTEIN / CREATININE RATIO, URINE
Creatinine, Urine: 107.14 mg/dL
Protein Creatinine Ratio: 0.84 mg/mg{Cre} — ABNORMAL HIGH (ref 0.00–0.15)
Total Protein, Urine: 90 mg/dL

## 2019-05-21 LAB — URINALYSIS, ROUTINE W REFLEX MICROSCOPIC
Bilirubin Urine: NEGATIVE
Glucose, UA: NEGATIVE mg/dL
Ketones, ur: NEGATIVE mg/dL
Leukocytes,Ua: NEGATIVE
Nitrite: NEGATIVE
Protein, ur: 100 mg/dL — AB
Specific Gravity, Urine: 1.016 (ref 1.005–1.030)
pH: 6 (ref 5.0–8.0)

## 2019-05-21 LAB — COMPREHENSIVE METABOLIC PANEL
ALT: 12 U/L (ref 0–44)
AST: 20 U/L (ref 15–41)
Albumin: 3.1 g/dL — ABNORMAL LOW (ref 3.5–5.0)
Alkaline Phosphatase: 126 U/L (ref 38–126)
Anion gap: 12 (ref 5–15)
BUN: 8 mg/dL (ref 6–20)
CO2: 18 mmol/L — ABNORMAL LOW (ref 22–32)
Calcium: 9.1 mg/dL (ref 8.9–10.3)
Chloride: 107 mmol/L (ref 98–111)
Creatinine, Ser: 0.44 mg/dL (ref 0.44–1.00)
GFR calc Af Amer: >60 mL/min (ref >60)
GFR calc non Af Amer: >60 mL/min (ref >60)
Glucose, Bld: 77 mg/dL (ref 70–99)
Potassium: 4 mmol/L (ref 3.5–5.1)
Sodium: 137 mmol/L (ref 135–145)
Total Bilirubin: 0.6 mg/dL (ref 0.3–1.2)
Total Protein: 6.7 g/dL (ref 6.5–8.1)

## 2019-05-21 LAB — TYPE AND SCREEN
ABO/RH(D): O POS
Antibody Screen: NEGATIVE

## 2019-05-21 LAB — SARS CORONAVIRUS 2 BY RT PCR (HOSPITAL ORDER, PERFORMED IN ~~LOC~~ HOSPITAL LAB): SARS Coronavirus 2: NEGATIVE

## 2019-05-21 LAB — OB RESULTS CONSOLE GBS: GBS: NEGATIVE

## 2019-05-21 MED ORDER — OXYCODONE-ACETAMINOPHEN 5-325 MG PO TABS: 2.0000 | ORAL_TABLET | ORAL | Status: DC | PRN

## 2019-05-21 MED ORDER — LIDOCAINE HCL (PF) 1 % IJ SOLN
30.0000 mL | INTRAMUSCULAR | Status: AC | PRN
  Administered 2019-05-22: 30 mL via SUBCUTANEOUS
  Filled 2019-05-21: qty 30

## 2019-05-21 MED ORDER — OXYTOCIN 40 UNITS IN NORMAL SALINE INFUSION - SIMPLE MED: 1.0000 m[IU]/min | INTRAVENOUS | Status: DC

## 2019-05-21 MED ORDER — LACTATED RINGERS IV SOLN
INTRAVENOUS | Status: DC
  Administered 2019-05-21: 13:00:00 via INTRAVENOUS

## 2019-05-21 MED ORDER — OXYTOCIN 40 UNITS IN NORMAL SALINE INFUSION - SIMPLE MED
2.5000 [IU]/h | INTRAVENOUS | Status: DC
  Filled 2019-05-21: qty 1000

## 2019-05-21 MED ORDER — LACTATED RINGERS IV SOLN
500.0000 mL | INTRAVENOUS | Status: DC | PRN
  Administered 2019-05-22: 500 mL via INTRAVENOUS

## 2019-05-21 MED ORDER — MISOPROSTOL 50MCG HALF TABLET
50.0000 ug | ORAL_TABLET | ORAL | Status: DC | PRN
  Administered 2019-05-21: 16:00:00 50 ug via BUCCAL
  Filled 2019-05-21: qty 1

## 2019-05-21 MED ORDER — OXYCODONE-ACETAMINOPHEN 5-325 MG PO TABS: 1.0000 | ORAL_TABLET | ORAL | Status: DC | PRN

## 2019-05-21 MED ORDER — SOD CITRATE-CITRIC ACID 500-334 MG/5ML PO SOLN: 30.0000 mL | ORAL | Status: DC | PRN

## 2019-05-21 MED ORDER — LABETALOL HCL 5 MG/ML IV SOLN
20.0000 mg | INTRAVENOUS | Status: DC | PRN
  Administered 2019-05-22: 15:00:00 20 mg via INTRAVENOUS
  Filled 2019-05-21: qty 4

## 2019-05-21 MED ORDER — ACETAMINOPHEN 325 MG PO TABS: 650.0000 mg | ORAL_TABLET | ORAL | Status: DC | PRN

## 2019-05-21 MED ORDER — FENTANYL CITRATE (PF) 100 MCG/2ML IJ SOLN
100.0000 ug | INTRAMUSCULAR | Status: DC | PRN
  Administered 2019-05-21 – 2019-05-22 (×2): 100 ug via INTRAVENOUS
  Filled 2019-05-21 (×2): qty 2

## 2019-05-21 MED ORDER — LABETALOL HCL 5 MG/ML IV SOLN
80.0000 mg | INTRAVENOUS | Status: DC | PRN
  Administered 2019-05-22: 16:00:00 80 mg via INTRAVENOUS
  Filled 2019-05-21: qty 16

## 2019-05-21 MED ORDER — HYDRALAZINE HCL 20 MG/ML IJ SOLN: 10.0000 mg | INTRAMUSCULAR | Status: DC | PRN

## 2019-05-21 MED ORDER — ONDANSETRON HCL 4 MG/2ML IJ SOLN: 4.0000 mg | Freq: Four times a day (QID) | INTRAMUSCULAR | Status: DC | PRN

## 2019-05-21 MED ORDER — LACTATED RINGERS IV SOLN
INTRAVENOUS | Status: DC
  Administered 2019-05-21 – 2019-05-22 (×3): via INTRAVENOUS

## 2019-05-21 MED ORDER — TERBUTALINE SULFATE 1 MG/ML IJ SOLN
0.2500 mg | Freq: Once | INTRAMUSCULAR | Status: AC | PRN
  Administered 2019-05-21: 0.25 mg via SUBCUTANEOUS
  Filled 2019-05-21: qty 1

## 2019-05-21 MED ORDER — LABETALOL HCL 5 MG/ML IV SOLN
40.0000 mg | INTRAVENOUS | Status: DC | PRN
  Administered 2019-05-22: 16:00:00 40 mg via INTRAVENOUS
  Filled 2019-05-21: qty 8

## 2019-05-21 MED ORDER — OXYTOCIN BOLUS FROM INFUSION
500.0000 mL | Freq: Once | INTRAVENOUS | Status: AC
  Administered 2019-05-22: 500 mL via INTRAVENOUS

## 2019-05-21 NOTE — MAU Note (Signed)
.   Erika Christian is a 23 y.o. at [redacted]w[redacted]d here in MAU, sent from the health dept for increase in blood pressure today. Denies any HA or blurred vision  Onset of complaint: no complaints Pain score: 0 Vitals:   05/21/19 1212  BP: (!) 160/107  Pulse: 85  Resp: 16  Temp: 97.7 F (36.5 C)  SpO2: 100%     FHT 135 Lab orders placed from triage:

## 2019-05-21 NOTE — MAU Note (Signed)
Covid swab collected. PT tolerated well. PT asymptomatic 

## 2019-05-21 NOTE — Progress Notes (Addendum)
LABOR PROGRESS NOTE  Erika Christian is a 23 y.o. G1P0000 at [redacted]w[redacted]d  admitted for IOL due to preE.   Subjective: She is in bed doing well resting in minimal discomfort with contractions.  Objective: BP 134/87   Pulse 98   Temp 98.2 F (36.8 C) (Oral)   Resp 18   Ht 4\' 8"  (1.422 m)   Wt 94.8 kg   LMP 08/24/2018   SpO2 100%   BMI 46.86 kg/m  or  Vitals:   05/21/19 1810 05/21/19 1933 05/21/19 2117 05/21/19 2136  BP: (!) 150/81 (!) 156/87 (!) 176/84 134/87  Pulse: (!) 114 (!) 101 98 98  Resp: 16 16 18    Temp:  98.2 F (36.8 C)    TempSrc:  Oral    SpO2:      Weight:      Height:         Dilation: 1 Effacement (%): 60, 70 Station: -3 Presentation: Vertex Exam by:: Lelan Pons, CNM FHT: baseline rate 130, moderate varibility, 15 x15 acel, no decel Toco: 3 mins  Labs: Lab Results  Component Value Date   WBC 12.8 (H) 05/21/2019   HGB 12.2 05/21/2019   HCT 36.6 05/21/2019   MCV 88.0 05/21/2019   PLT 250 05/21/2019    Patient Active Problem List   Diagnosis Date Noted  . Pre-eclampsia 05/21/2019    Assessment / Plan: 23 y.o. G1P0000 at [redacted]w[redacted]d here for IOL due to preE.  Labor: FB was placed by Hurshel Keys @ 2058. Cytotec x 1557. Continue cervical exams for the progression of labor. Consider pitocin as labor progresses. Fetal Wellbeing: Cat I Pain Control:  Maternally supported. Plan for an epidural. Anticipated MOD:  Vaginal  Kimi Kroft Autry-Lott, D.O. Family Medicine Resident, PGY-1 05/21/2019, 10:18 PM

## 2019-05-21 NOTE — H&P (Addendum)
LABOR AND DELIVERY ADMISSION HISTORY AND PHYSICAL NOTE  Erika Christian is a 23 y.o. female G1P0 with IUP at 4544w4d presenting for IOL due to pre-eclampsia. Patient initially presented to the health department and was found to have elevated blood pressures (140/91). Patient was evaluated in MAU and noted to be asymptomatic with pressures in the 140s/80s-90s once appropriate blood pressure cuff was applied. She reports positive fetal movement. She denies leakage of fluid or vaginal bleeding.  Prenatal History/Complications: PNC at HD Pregnancy complications:  - Pre-eclampsia with elevated BPs and UPC 0.84   Past Medical History: Past Medical History:  Diagnosis Date  . Medical history non-contributory     Past Surgical History: Past Surgical History:  Procedure Laterality Date  . APPENDECTOMY      Obstetrical History: OB History    Gravida  1   Para      Term      Preterm      AB      Living        SAB      TAB      Ectopic      Multiple      Live Births              Social History: Social History   Socioeconomic History  . Marital status: Single    Spouse name: Not on file  . Number of children: Not on file  . Years of education: Not on file  . Highest education level: Not on file  Occupational History  . Not on file  Social Needs  . Financial resource strain: Not on file  . Food insecurity    Worry: Not on file    Inability: Not on file  . Transportation needs    Medical: Not on file    Non-medical: Not on file  Tobacco Use  . Smoking status: Never Smoker  . Smokeless tobacco: Never Used  Substance and Sexual Activity  . Alcohol use: Not Currently    Comment: occasionally  . Drug use: No  . Sexual activity: Yes  Lifestyle  . Physical activity    Days per week: Not on file    Minutes per session: Not on file  . Stress: Not on file  Relationships  . Social Musicianconnections    Talks on phone: Not on file    Gets together: Not on file     Attends religious service: Not on file    Active member of club or organization: Not on file    Attends meetings of clubs or organizations: Not on file    Relationship status: Not on file  Other Topics Concern  . Not on file  Social History Narrative  . Not on file    Family History: History reviewed. No pertinent family history.  Allergies: Allergies  Allergen Reactions  . Orange Fruit [Citrus]     Medications Prior to Admission  Medication Sig Dispense Refill Last Dose  . prenatal vitamin w/FE, FA (PRENATAL 1 + 1) 27-1 MG TABS tablet Take 1 tablet by mouth daily at 12 noon.   05/21/2019 at 0700  . albuterol (PROVENTIL HFA;VENTOLIN HFA) 108 (90 Base) MCG/ACT inhaler Inhale 2 puffs into the lungs every 4 (four) hours as needed for wheezing or shortness of breath. 1 Inhaler 0   . amoxicillin-clavulanate (AUGMENTIN) 875-125 MG tablet Take 1 tablet by mouth every 12 (twelve) hours. 14 tablet 0   . benzonatate (TESSALON) 100 MG capsule Take 1 capsule (100 mg  total) by mouth every 8 (eight) hours. 21 capsule 0   . chlorpheniramine-HYDROcodone (TUSSIONEX PENNKINETIC ER) 10-8 MG/5ML SUER Take 5 mLs by mouth at bedtime as needed for cough. 60 mL 0   . diphenhydramine-acetaminophen (TYLENOL PM) 25-500 MG TABS tablet Take 1 tablet by mouth at bedtime as needed.     Marland Kitchen ipratropium (ATROVENT) 0.06 % nasal spray Place 2 sprays into both nostrils 4 (four) times daily. 15 mL 0   . meloxicam (MOBIC) 7.5 MG tablet Take 1 tablet (7.5 mg total) by mouth daily. 15 tablet 0      Review of Systems  All systems reviewed and negative except as stated in HPI  Physical Exam Blood pressure (!) 148/93, pulse 95, temperature 97.7 F (36.5 C), resp. rate 20, weight 97.1 kg, last menstrual period 08/24/2018, SpO2 100 %. General appearance: Alert, oriented, NAD Lungs: Normal respiratory effort Heart: Regular rate Abdomen: Soft, non-tender; gravid, FH appropriate for GA Extremities: No calf swelling or  tenderness Presentation: Cephalic via SVE Fetal monitoring: Baseline rate 130, moderate variability, + accels, -decels Uterine activity: Contractions every 6-8 minutes Dilation: 1 Effacement (%): 50 Station: -2 Exam by:: hogan cnm  Prenatal labs: ABO, Rh:  O POS Antibody:  Negative Rubella:  Non-immune RPR:  Negative HBsAg:  Negative HIV:  NR GC/Chlamydia: Negative  GBS:  Negative   1-hr GTT: 114 Genetic screening: Quad screen negative Anatomy US: Normal growth  Prenatal Transfer Tool  Maternal Diabetes: No Genetic Screening: Normal Maternal Ultrasounds/Referrals: Normal Fetal Ultrasounds or other Referrals:  None Maternal Substance Abuse:  No Significant Maternal Medications:  None Significant Maternal Lab Results: Group B Strep negative  Results for orders placed or performed during the hospital encounter of 05/21/19 (from the past 24 hour(s))  Comprehensive metabolic panel   Collection Time: 05/21/19  1:11 PM  Result Value Ref Range   Sodium 137 135 - 145 mmol/L   Potassium 4.0 3.5 - 5.1 mmol/L   Chloride 107 98 - 111 mmol/L   CO2 18 (L) 22 - 32 mmol/L   Glucose, Bld 77 70 - 99 mg/dL   BUN 8 6 - 20 mg/dL   Creatinine, Ser 0.44 0.44 - 1.00 mg/dL   Calcium 9.1 8.9 - 10.3 mg/dL   Total Protein 6.7 6.5 - 8.1 g/dL   Albumin 3.1 (L) 3.5 - 5.0 g/dL   AST 20 15 - 41 U/L   ALT 12 0 - 44 U/L   Alkaline Phosphatase 126 38 - 126 U/L   Total Bilirubin 0.6 0.3 - 1.2 mg/dL   GFR calc non Af Amer >60 >60 mL/min   GFR calc Af Amer >60 >60 mL/min   Anion gap 12 5 - 15  CBC with Differential   Collection Time: 05/21/19  1:11 PM  Result Value Ref Range   WBC 12.8 (H) 4.0 - 10.5 K/uL   RBC 4.16 3.87 - 5.11 MIL/uL   Hemoglobin 12.2 12.0 - 15.0 g/dL   HCT 36.6 36.0 - 46.0 %   MCV 88.0 80.0 - 100.0 fL   MCH 29.3 26.0 - 34.0 pg   MCHC 33.3 30.0 - 36.0 g/dL   RDW 14.6 11.5 - 15.5 %   Platelets 250 150 - 400 K/uL   nRBC 0.0 0.0 - 0.2 %   Neutrophils Relative % 78 %   Neutro  Abs 10.0 (H) 1.7 - 7.7 K/uL   Lymphocytes Relative 14 %   Lymphs Abs 1.7 0.7 - 4.0 K/uL   Monocytes Relative 6 %  Monocytes Absolute 0.8 0.1 - 1.0 K/uL   Eosinophils Relative 1 %   Eosinophils Absolute 0.1 0.0 - 0.5 K/uL   Basophils Relative 0 %   Basophils Absolute 0.0 0.0 - 0.1 K/uL   Immature Granulocytes 1 %   Abs Immature Granulocytes 0.12 (H) 0.00 - 0.07 K/uL  Urinalysis, Routine w reflex microscopic   Collection Time: 05/21/19  1:25 PM  Result Value Ref Range   Color, Urine YELLOW YELLOW   APPearance HAZY (A) CLEAR   Specific Gravity, Urine 1.016 1.005 - 1.030   pH 6.0 5.0 - 8.0   Glucose, UA NEGATIVE NEGATIVE mg/dL   Hgb urine dipstick SMALL (A) NEGATIVE   Bilirubin Urine NEGATIVE NEGATIVE   Ketones, ur NEGATIVE NEGATIVE mg/dL   Protein, ur 811100 (A) NEGATIVE mg/dL   Nitrite NEGATIVE NEGATIVE   Leukocytes,Ua NEGATIVE NEGATIVE   RBC / HPF 0-5 0 - 5 RBC/hpf   WBC, UA 0-5 0 - 5 WBC/hpf   Bacteria, UA RARE (A) NONE SEEN   Squamous Epithelial / LPF 0-5 0 - 5   Mucus PRESENT   Protein / creatinine ratio, urine   Collection Time: 05/21/19  1:25 PM  Result Value Ref Range   Creatinine, Urine 107.14 mg/dL   Total Protein, Urine 90 mg/dL   Protein Creatinine Ratio 0.84 (H) 0.00 - 0.15 mg/mg[Cre]    There are no active problems to display for this patient.  Assessment: Erika LevelShaquasia Vancuren is a 23 y.o. G1P0 at 3368w4d here for IOL secondary to pre-eclampsia.  #Labor: IOL with cervical ripening based on cervical exam in triage. Will start with Cytotec 50 mcg PO and reassess in ~ 4 hours. #Pain: IV pain medicine PRN for now; epidural if not adequate #FWB:  Category 1 tracing as above #ID: GBS negative; no antibiotics indicated #MOF: Breast milk; would like to pump #MOC: Considering OCPs #Circ: Desires; will likely be outpatient #Pre-eclampsia: CMP/CBC stable; UPC 0.84. Asymptomatic. Will continue to monitor BP and symptoms.  Worthy RancherChristina M Albert 05/21/2019, 3:22 PM   OB  FELLOW HISTORY AND PHYSICAL ATTESTATION  I have seen and examined this patient; I agree with above documentation in the resident's note.   Marcy Sirenatherine Wallace, D.O. OB Fellow  05/21/2019, 6:17 PM

## 2019-05-21 NOTE — MAU Provider Note (Addendum)
History     CSN: [id number]  Arrival date and time: 05/21/19 1201   First Provider Initiated Contact with Patient 05/21/19 1238      Chief Complaint  Patient presents with  . Hypertension   Erika Christian is a 23 y.o. G1P0 at 3566w4d who presents today from the health department with elevated blood pressure. She states that last week at her visit her blood pressure was a slightly elevated. She doesn't rememeber exactly, but around 130/80. Today at her visit her blood pressure was 140/91, so she was sent here for evaluation. She denies any complications with this pregnancy. She denies any HA, visual disturbances or RUQ pain. She reports normal fetal movement. She denies any contractions, VB or  LOF.   Hypertension This is a new problem. The current episode started in the past 7 days. The problem has been gradually worsening since onset. Pertinent negatives include no headaches. Associated agents: Pregnancy  Past treatments include nothing.    OB History    Gravida  1   Para      Term      Preterm      AB      Living        SAB      TAB      Ectopic      Multiple      Live Births              Past Medical History:  Diagnosis Date  . Medical history non-contributory     Past Surgical History:  Procedure Laterality Date  . APPENDECTOMY      History reviewed. No pertinent family history.  Social History   Tobacco Use  . Smoking status: Never Smoker  . Smokeless tobacco: Never Used  Substance Use Topics  . Alcohol use: Not Currently    Comment: occasionally  . Drug use: No    Allergies:  Allergies  Allergen Reactions  . Orange Fruit [Citrus]     Medications Prior to Admission  Medication Sig Dispense Refill Last Dose  . prenatal vitamin w/FE, FA (PRENATAL 1 + 1) 27-1 MG TABS tablet Take 1 tablet by mouth daily at 12 noon.   05/21/2019 at 0700  . albuterol (PROVENTIL HFA;VENTOLIN HFA) 108 (90 Base) MCG/ACT inhaler Inhale 2 puffs into the lungs  every 4 (four) hours as needed for wheezing or shortness of breath. 1 Inhaler 0   . amoxicillin-clavulanate (AUGMENTIN) 875-125 MG tablet Take 1 tablet by mouth every 12 (twelve) hours. 14 tablet 0   . benzonatate (TESSALON) 100 MG capsule Take 1 capsule (100 mg total) by mouth every 8 (eight) hours. 21 capsule 0   . chlorpheniramine-HYDROcodone (TUSSIONEX PENNKINETIC ER) 10-8 MG/5ML SUER Take 5 mLs by mouth at bedtime as needed for cough. 60 mL 0   . diphenhydramine-acetaminophen (TYLENOL PM) 25-500 MG TABS tablet Take 1 tablet by mouth at bedtime as needed.     Marland Kitchen. ipratropium (ATROVENT) 0.06 % nasal spray Place 2 sprays into both nostrils 4 (four) times daily. 15 mL 0   . meloxicam (MOBIC) 7.5 MG tablet Take 1 tablet (7.5 mg total) by mouth daily. 15 tablet 0     Review of Systems  Constitutional: Negative for chills and fever.  Eyes: Negative for visual disturbance.  Gastrointestinal: Negative for abdominal pain, nausea and vomiting.  Genitourinary: Negative for pelvic pain, vaginal bleeding and vaginal discharge.  Neurological: Negative for headaches.   Physical Exam   Blood pressure (!) 148/88, pulse  93, temperature 97.7 F (36.5 C), resp. rate 20, weight 97.1 kg, last menstrual period 08/24/2018, SpO2 100 %.  Physical Exam  Nursing note and vitals reviewed. Constitutional: She is oriented to person, place, and time. She appears well-developed and well-nourished. No distress.  HENT:  Head: Normocephalic.  Cardiovascular: Normal rate.  Respiratory: Effort normal.  GI: Soft. There is no abdominal tenderness. There is no rebound.  Neurological: She is alert and oriented to person, place, and time. She has normal reflexes.  No clonus   Skin: Skin is warm and dry.  Psychiatric: She has a normal mood and affect.   Dilation: 1 Effacement (%): 50 Station: -2 Presentation: Vertex Exam by::  cnm  NST:  Baseline: 145 Variability: moderate Accels: 15x15 Decels: none Toco:  none  Results for orders placed or performed during the hospital encounter of 05/21/19 (from the past 24 hour(s))  Comprehensive metabolic panel     Status: Abnormal   Collection Time: 05/21/19  1:11 PM  Result Value Ref Range   Sodium 137 135 - 145 mmol/L   Potassium 4.0 3.5 - 5.1 mmol/L   Chloride 107 98 - 111 mmol/L   CO2 18 (L) 22 - 32 mmol/L   Glucose, Bld 77 70 - 99 mg/dL   BUN 8 6 - 20 mg/dL   Creatinine, Ser 1.610.44 0.44 - 1.00 mg/dL   Calcium 9.1 8.9 - 09.610.3 mg/dL   Total Protein 6.7 6.5 - 8.1 g/dL   Albumin 3.1 (L) 3.5 - 5.0 g/dL   AST 20 15 - 41 U/L   ALT 12 0 - 44 U/L   Alkaline Phosphatase 126 38 - 126 U/L   Total Bilirubin 0.6 0.3 - 1.2 mg/dL   GFR calc non Af Amer >60 >60 mL/min   GFR calc Af Amer >60 >60 mL/min   Anion gap 12 5 - 15  CBC with Differential     Status: Abnormal   Collection Time: 05/21/19  1:11 PM  Result Value Ref Range   WBC 12.8 (H) 4.0 - 10.5 K/uL   RBC 4.16 3.87 - 5.11 MIL/uL   Hemoglobin 12.2 12.0 - 15.0 g/dL   HCT 04.536.6 40.936.0 - 81.146.0 %   MCV 88.0 80.0 - 100.0 fL   MCH 29.3 26.0 - 34.0 pg   MCHC 33.3 30.0 - 36.0 g/dL   RDW 91.414.6 78.211.5 - 95.615.5 %   Platelets 250 150 - 400 K/uL   nRBC 0.0 0.0 - 0.2 %   Neutrophils Relative % 78 %   Neutro Abs 10.0 (H) 1.7 - 7.7 K/uL   Lymphocytes Relative 14 %   Lymphs Abs 1.7 0.7 - 4.0 K/uL   Monocytes Relative 6 %   Monocytes Absolute 0.8 0.1 - 1.0 K/uL   Eosinophils Relative 1 %   Eosinophils Absolute 0.1 0.0 - 0.5 K/uL   Basophils Relative 0 %   Basophils Absolute 0.0 0.0 - 0.1 K/uL   Immature Granulocytes 1 %   Abs Immature Granulocytes 0.12 (H) 0.00 - 0.07 K/uL  Urinalysis, Routine w reflex microscopic     Status: Abnormal   Collection Time: 05/21/19  1:25 PM  Result Value Ref Range   Color, Urine YELLOW YELLOW   APPearance HAZY (A) CLEAR   Specific Gravity, Urine 1.016 1.005 - 1.030   pH 6.0 5.0 - 8.0   Glucose, UA NEGATIVE NEGATIVE mg/dL   Hgb urine dipstick SMALL (A) NEGATIVE   Bilirubin Urine  NEGATIVE NEGATIVE   Ketones, ur NEGATIVE NEGATIVE  mg/dL   Protein, ur 100 (A) NEGATIVE mg/dL   Nitrite NEGATIVE NEGATIVE   Leukocytes,Ua NEGATIVE NEGATIVE   RBC / HPF 0-5 0 - 5 RBC/hpf   WBC, UA 0-5 0 - 5 WBC/hpf   Bacteria, UA RARE (A) NONE SEEN   Squamous Epithelial / LPF 0-5 0 - 5   Mucus PRESENT   Protein / creatinine ratio, urine     Status: Abnormal   Collection Time: 05/21/19  1:25 PM  Result Value Ref Range   Creatinine, Urine 107.14 mg/dL   Total Protein, Urine 90 mg/dL   Protein Creatinine Ratio 0.84 (H) 0.00 - 0.15 mg/mg[Cre]   Vitals:   05/21/19 1300 05/21/19 1316 05/21/19 1331 05/21/19 1345  BP: (!) 148/98 (!) 147/86 (!) 147/93 (!) 148/93  Pulse: 100 100 (!) 106 95  Resp:      Temp:      SpO2:      Weight:       MAU Course  Procedures  MDM On arrival BP was severe range with regular size cuff. Patient reports that they have been using the larger cuff for her visits. Switched to larger cuff and BP more consistent with office readings. Will continue to monitor and give labetalol if she has another severe range with appropriate cuff.   Assessment and Plan  Pre-eclampsia at [redacted]w[redacted]d  Admit to labor and delivery  No severe features at this time Will need cervical ripening with FB or cytotec GBS negative  Marcille Buffy DNP, CNM  05/21/19  2:58 PM

## 2019-05-22 ENCOUNTER — Inpatient Hospital Stay (HOSPITAL_COMMUNITY): Payer: Medicaid Other | Admitting: Anesthesiology

## 2019-05-22 ENCOUNTER — Encounter (HOSPITAL_COMMUNITY): Payer: Self-pay | Admitting: *Deleted

## 2019-05-22 DIAGNOSIS — Z3A38 38 weeks gestation of pregnancy: Secondary | ICD-10-CM

## 2019-05-22 DIAGNOSIS — O1404 Mild to moderate pre-eclampsia, complicating childbirth: Secondary | ICD-10-CM

## 2019-05-22 DIAGNOSIS — O1414 Severe pre-eclampsia complicating childbirth: Secondary | ICD-10-CM | POA: Diagnosis not present

## 2019-05-22 LAB — CBC
HCT: 34.6 % — ABNORMAL LOW (ref 36.0–46.0)
HCT: 35.7 % — ABNORMAL LOW (ref 36.0–46.0)
Hemoglobin: 11.6 g/dL — ABNORMAL LOW (ref 12.0–15.0)
Hemoglobin: 11.7 g/dL — ABNORMAL LOW (ref 12.0–15.0)
MCH: 28.6 pg (ref 26.0–34.0)
MCH: 28.4 pg (ref 26.0–34.0)
MCHC: 33.5 g/dL (ref 30.0–36.0)
MCHC: 32.8 g/dL (ref 30.0–36.0)
MCV: 85.4 fL (ref 80.0–100.0)
MCV: 86.7 fL (ref 80.0–100.0)
Platelets: 233 10*3/uL (ref 150–400)
Platelets: 234 10*3/uL (ref 150–400)
RBC: 4.05 MIL/uL (ref 3.87–5.11)
RBC: 4.12 MIL/uL (ref 3.87–5.11)
RDW: 14.4 % (ref 11.5–15.5)
RDW: 14.5 % (ref 11.5–15.5)
WBC: 15.2 10*3/uL — ABNORMAL HIGH (ref 4.0–10.5)
WBC: 25.7 10*3/uL — ABNORMAL HIGH (ref 4.0–10.5)
nRBC: 0 % (ref 0.0–0.2)
nRBC: 0 % (ref 0.0–0.2)

## 2019-05-22 LAB — ABO/RH: ABO/RH(D): O POS

## 2019-05-22 LAB — RPR: RPR Ser Ql: NONREACTIVE

## 2019-05-22 MED ORDER — COCONUT OIL OIL: 1.0000 "application " | TOPICAL_OIL | Status: DC | PRN

## 2019-05-22 MED ORDER — FENTANYL-BUPIVACAINE-NACL 0.5-0.125-0.9 MG/250ML-% EP SOLN: 12.0000 mL/h | EPIDURAL | Status: DC | PRN

## 2019-05-22 MED ORDER — DIBUCAINE (PERIANAL) 1 % EX OINT: 1.0000 "application " | TOPICAL_OINTMENT | CUTANEOUS | Status: DC | PRN

## 2019-05-22 MED ORDER — LACTATED RINGERS IV SOLN
INTRAVENOUS | Status: AC
  Administered 2019-05-22 – 2019-05-23 (×2): via INTRAVENOUS

## 2019-05-22 MED ORDER — DIPHENHYDRAMINE HCL 50 MG/ML IJ SOLN: 12.5000 mg | INTRAMUSCULAR | Status: DC | PRN

## 2019-05-22 MED ORDER — IBUPROFEN 600 MG PO TABS
600.0000 mg | ORAL_TABLET | Freq: Four times a day (QID) | ORAL | Status: DC
  Administered 2019-05-22 – 2019-05-24 (×6): 600 mg via ORAL
  Filled 2019-05-22 (×6): qty 1

## 2019-05-22 MED ORDER — EPHEDRINE 5 MG/ML INJ
10.0000 mg | INTRAVENOUS | Status: DC | PRN
  Filled 2019-05-22: qty 2

## 2019-05-22 MED ORDER — WITCH HAZEL-GLYCERIN EX PADS: 1.0000 "application " | MEDICATED_PAD | CUTANEOUS | Status: DC | PRN

## 2019-05-22 MED ORDER — MAGNESIUM SULFATE BOLUS VIA INFUSION
4.0000 g | Freq: Once | INTRAVENOUS | Status: AC
  Administered 2019-05-22: 16:00:00 4 g via INTRAVENOUS
  Filled 2019-05-22: qty 500

## 2019-05-22 MED ORDER — ACETAMINOPHEN 325 MG PO TABS
650.0000 mg | ORAL_TABLET | ORAL | Status: DC | PRN
  Administered 2019-05-22: 650 mg via ORAL
  Filled 2019-05-22: qty 2

## 2019-05-22 MED ORDER — BENZOCAINE-MENTHOL 20-0.5 % EX AERO
1.0000 "application " | INHALATION_SPRAY | CUTANEOUS | Status: DC | PRN
  Filled 2019-05-22: qty 56

## 2019-05-22 MED ORDER — PHENYLEPHRINE 40 MCG/ML (10ML) SYRINGE FOR IV PUSH (FOR BLOOD PRESSURE SUPPORT)
80.0000 ug | PREFILLED_SYRINGE | INTRAVENOUS | Status: DC | PRN
  Filled 2019-05-22: qty 10

## 2019-05-22 MED ORDER — LACTATED RINGERS IV SOLN: 500.0000 mL | Freq: Once | INTRAVENOUS | Status: DC

## 2019-05-22 MED ORDER — ONDANSETRON HCL 4 MG/2ML IJ SOLN: 4.0000 mg | INTRAMUSCULAR | Status: DC | PRN

## 2019-05-22 MED ORDER — TETANUS-DIPHTH-ACELL PERTUSSIS 5-2.5-18.5 LF-MCG/0.5 IM SUSP: 0.5000 mL | Freq: Once | INTRAMUSCULAR | Status: DC

## 2019-05-22 MED ORDER — PHENYLEPHRINE 40 MCG/ML (10ML) SYRINGE FOR IV PUSH (FOR BLOOD PRESSURE SUPPORT)
80.0000 ug | PREFILLED_SYRINGE | INTRAVENOUS | Status: DC | PRN
  Filled 2019-05-22 (×2): qty 10

## 2019-05-22 MED ORDER — OXYTOCIN 40 UNITS IN NORMAL SALINE INFUSION - SIMPLE MED
1.0000 m[IU]/min | INTRAVENOUS | Status: DC
  Administered 2019-05-22: 2 m[IU]/min via INTRAVENOUS

## 2019-05-22 MED ORDER — SENNOSIDES-DOCUSATE SODIUM 8.6-50 MG PO TABS
2.0000 | ORAL_TABLET | ORAL | Status: DC
  Administered 2019-05-22 – 2019-05-23 (×2): 2 via ORAL
  Filled 2019-05-22 (×2): qty 2

## 2019-05-22 MED ORDER — DIPHENHYDRAMINE HCL 25 MG PO CAPS: 25.0000 mg | ORAL_CAPSULE | Freq: Four times a day (QID) | ORAL | Status: DC | PRN

## 2019-05-22 MED ORDER — PRENATAL MULTIVITAMIN CH
1.0000 | ORAL_TABLET | Freq: Every day | ORAL | Status: DC
  Administered 2019-05-23 – 2019-05-24 (×2): 1 via ORAL
  Filled 2019-05-22 (×2): qty 1

## 2019-05-22 MED ORDER — MAGNESIUM SULFATE 40 G IN LACTATED RINGERS - SIMPLE
2.0000 g/h | INTRAVENOUS | Status: AC
  Administered 2019-05-23: 09:00:00 2 g/h via INTRAVENOUS
  Filled 2019-05-22 (×2): qty 500

## 2019-05-22 MED ORDER — FENTANYL-BUPIVACAINE-NACL 0.5-0.125-0.9 MG/250ML-% EP SOLN
12.0000 mL/h | EPIDURAL | Status: DC | PRN
  Filled 2019-05-22: qty 250

## 2019-05-22 MED ORDER — LIDOCAINE HCL (PF) 1 % IJ SOLN
INTRAMUSCULAR | Status: DC | PRN
  Administered 2019-05-22 (×2): 5 mL via EPIDURAL

## 2019-05-22 MED ORDER — ONDANSETRON HCL 4 MG PO TABS: 4.0000 mg | ORAL_TABLET | ORAL | Status: DC | PRN

## 2019-05-22 MED ORDER — SODIUM CHLORIDE (PF) 0.9 % IJ SOLN
INTRAMUSCULAR | Status: DC | PRN
  Administered 2019-05-22: 12 mL/h via EPIDURAL

## 2019-05-22 MED ORDER — SIMETHICONE 80 MG PO CHEW: 80.0000 mg | CHEWABLE_TABLET | ORAL | Status: DC | PRN

## 2019-05-22 MED ORDER — MEASLES, MUMPS & RUBELLA VAC IJ SOLR: 0.5000 mL | Freq: Once | INTRAMUSCULAR | Status: DC

## 2019-05-22 NOTE — Progress Notes (Signed)
Labor Progress Note Erika Christian is a 23 y.o. G1P0000 at [redacted]w[redacted]d presented for IOL for PEC  S:  Comfortable with epidural  O:  BP (!) 151/88   Pulse 99   Temp 98.8 F (37.1 C) (Oral)   Resp 17   Ht 4\' 8"  (1.422 m)   Wt 94.8 kg   LMP 08/24/2018   SpO2 99%   BMI 46.86 kg/m  EFM: baseline 145 bpm/ mod variability/ + accels/ variable and early decels  Toco: 3-4 SVE: Dilation: 6 Effacement (%): 80 Cervical Position: Anterior Station: -1 Presentation: Vertex Exam by:: Julianne Handler, CNM Pitocin: 4 mu/min  A/P: 23 y.o. G1P0000 [redacted]w[redacted]d  1. Labor: early active 2. FWB: Cat II 3. Pain: epidural  IUPC placed, may need amnioinfusion. Maternal repositioning and peanut ball. Anticipate labor progress and SVD.  Julianne Handler, CNM 9:15 AM

## 2019-05-22 NOTE — Anesthesia Preprocedure Evaluation (Signed)
Anesthesia Evaluation  Patient identified by MRN, date of birth, ID band Patient awake    Reviewed: Allergy & Precautions, H&P , NPO status , Patient's Chart, lab work & pertinent test results  History of Anesthesia Complications Negative for: history of anesthetic complications  Airway Mallampati: II  TM Distance: >3 FB Neck ROM: full    Dental no notable dental hx. (+) Teeth Intact   Pulmonary neg pulmonary ROS,    Pulmonary exam normal breath sounds clear to auscultation       Cardiovascular negative cardio ROS Normal cardiovascular exam Rhythm:regular Rate:Normal     Neuro/Psych negative neurological ROS  negative psych ROS   GI/Hepatic negative GI ROS, Neg liver ROS,   Endo/Other  Morbid obesity  Renal/GU negative Renal ROS  negative genitourinary   Musculoskeletal   Abdominal   Peds  Hematology negative hematology ROS (+)   Anesthesia Other Findings   Reproductive/Obstetrics (+) Pregnancy (PreE)                             Anesthesia Physical Anesthesia Plan  ASA: III  Anesthesia Plan: Epidural   Post-op Pain Management:    Induction:   PONV Risk Score and Plan:   Airway Management Planned:   Additional Equipment:   Intra-op Plan:   Post-operative Plan:   Informed Consent: I have reviewed the patients History and Physical, chart, labs and discussed the procedure including the risks, benefits and alternatives for the proposed anesthesia with the patient or authorized representative who has indicated his/her understanding and acceptance.       Plan Discussed with:   Anesthesia Plan Comments:         Anesthesia Quick Evaluation

## 2019-05-22 NOTE — Progress Notes (Signed)
Pt now with severe range BPs, treated with IV Labetalol. Since now with severe features will start Magnesium Sulfate.

## 2019-05-22 NOTE — Discharge Summary (Signed)
Postpartum Discharge Summary     Patient Name: Erika Christian DOB: 10-21-1996 MRN: [id number]  Date of admission: 05/21/2019 Delivering Provider: Donette LarryBHAMBRI, MELANIE   Date of discharge: 05/24/2019  Admitting diagnosis: IOL for mild pre-eclampsia Intrauterine pregnancy: 2438w4d     Secondary diagnosis:  None     Discharge diagnosis: Term Pregnancy Delivered and Preeclampsia (severe)                                                                                       Post partum procedures:None  Augmentation: Pitocin, Cytotec and Foley Balloon  Complications: None  Hospital course:  Induction of Labor With Vaginal Delivery   23 y.o. yo G1P0000 with IOL after [facility] elevated BPs and dx with mild pre-eclampsia on subsequent work up Patient had an uncomplicated labor course as follows: Membrane Rupture Time/Date: 1:44 AM ,05/22/2019   Intrapartum Procedures: Episiotomy: None [1]                                         Lacerations:  2nd degree [3];Perineal [11]  Patient had delivery of a Viable infant.  Information for the patient's newborn:  Lamount CohenSpencer, Boy Emerita [[id number]][[id number]]  Delivery Method: Vag-Spont   She developed severe range BPs immediately post delivery and was started on MgSO4 and she received 24 hours of this. She had mildly elevated BP and was started on labetalol 200 mg BID on PPD#2. By day of discharge, she was ambulating, eating, passing flatus, voiding without issue. She was discharged home in good condition. She will f/u with [facility] for BP check next week.   Anticipated Birth Control:  POPs PP Procedures needed: BP check  Schedule Integrated BH visit: no  Magnesium Sulfate recieved: Yes BMZ received: No  Physical exam  Vitals:   05/23/19 2310 05/23/19 2313 05/24/19 0651 05/24/19 0655  BP:  125/60 (!) 144/91   Pulse:  (!) 103 86   Resp:  18 18   Temp:  98.6 F (37 C) 98.4 F (36.9 C)   TempSrc:  Oral Oral   SpO2: 100% 100% 100% 100%   Weight:      Height:       General: alert, cooperative and no distress Lochia: appropriate Uterine Fundus: firm Incision: N/A DVT Evaluation: No evidence of DVT seen on physical exam. Labs: CBC Latest Ref Rng & Units 05/23/2019 05/22/2019 05/22/2019  WBC 4.0 - 10.5 K/uL 18.1(H) 25.7(H) 15.2(H)  Hemoglobin 12.0 - 15.0 g/dL 7.8(G9.8(L) 11.7(L) 11.6(L)  Hematocrit 36.0 - 46.0 % 29.8(L) 35.7(L) 34.6(L)  Platelets 150 - 400 K/uL 234 234 233   CMP Latest Ref Rng & Units 05/23/2019 05/21/2019  Glucose 70 - 99 mg/dL 956(O122(H) 77  BUN 6 - 20 mg/dL 6 8  Creatinine 1.300.44 - 1.00 mg/dL 8.650.60 7.840.44  Sodium 696135 - 145 mmol/L 138 137  Potassium 3.5 - 5.1 mmol/L 3.4(L) 4.0  Chloride 98 - 111 mmol/L 109 107  CO2 22 - 32 mmol/L 18(L) 18(L)  Calcium 8.9 - 10.3 mg/dL 7.7(L) 9.1  Total Protein  6.5 - 8.1 g/dL 5.3(L) 6.7  Total Bilirubin 0.3 - 1.2 mg/dL <0.1(L) 0.6  Alkaline Phos 38 - 126 U/L 100 126  AST 15 - 41 U/L 23 20  ALT 0 - 44 U/L 13 12    Discharge instruction: per After Visit Summary and "Baby and Me Booklet".  After visit meds:  Allergies as of 05/24/2019      Reactions   Orange Fruit [citrus] Hives      Medication List    TAKE these medications   ibuprofen 600 MG tablet Commonly known as: ADVIL Take 1 tablet (600 mg total) by mouth every 6 (six) hours.   labetalol 200 MG tablet Commonly known as: NORMODYNE Take 1 tablet (200 mg total) by mouth 2 (two) times daily.   prenatal multivitamin Tabs tablet Take 1 tablet by mouth daily at 12 noon.       Diet: routine diet  Activity: Advance as tolerated. Pelvic rest for 6 weeks.   Outpatient follow up:1wk bp check   Newborn Data: Live born female  Birth Weight: 2730gm APGAR: 12, 9  Newborn Delivery   Birth date/time: 05/22/2019 14:44:00 Delivery type: Vaginal, Spontaneous      Baby Feeding: Breast Disposition:home with mother   05/24/2019 Sloan Leiter, MD

## 2019-05-22 NOTE — Anesthesia Procedure Notes (Signed)
Epidural Patient location during procedure: OB  Staffing Anesthesiologist: Montez Hageman, MD Performed: anesthesiologist   Preanesthetic Checklist Completed: patient identified, site marked, surgical consent, pre-op evaluation, timeout performed, IV checked, risks and benefits discussed and monitors and equipment checked  Epidural Patient position: sitting Prep: DuraPrep Patient monitoring: heart rate, continuous pulse ox and blood pressure Approach: midline Location: L3-L4 Injection technique: LOR saline  Needle:  Needle type: Tuohy  Needle gauge: 17 G Needle length: 9 cm and 9 Needle insertion depth: 9 cm Catheter type: closed end flexible Catheter size: 20 Guage Catheter at skin depth: 13 cm Test dose: negative  Assessment Events: blood not aspirated, injection not painful, no injection resistance, negative IV test and no paresthesia  Additional Notes Patient identified. Risks/Benefits/Options discussed with patient including but not limited to bleeding, infection, nerve damage, paralysis, failed block, incomplete pain control, headache, blood pressure changes, nausea, vomiting, reactions to medication both or allergic, itching and postpartum back pain. Confirmed with bedside nurse the patient's most recent platelet count. Confirmed with patient that they are not currently taking any anticoagulation, have any bleeding history or any family history of bleeding disorders. Patient expressed understanding and wished to proceed. All questions were answered. Sterile technique was used throughout the entire procedure. Please see nursing notes for vital signs. Test dose was given through epidural needle and negative prior to continuing to dose epidural or start infusion. Warning signs of high block given to the patient including shortness of breath, tingling/numbness in hands, complete motor block, or any concerning symptoms with instructions to call for help. Patient was given  instructions on fall risk and not to get out of bed. All questions and concerns addressed with instructions to call with any issues.

## 2019-05-22 NOTE — Progress Notes (Signed)
Patient ID: Erika Christian, female   DOB: 1996/01/17, 23 y.o.   MRN: [id number] Late Note:  SROM ed at 0144hrs Requested and got an epidural for pain relief  Vitals:   05/22/19 0351 05/22/19 0356 05/22/19 0401 05/22/19 0405  BP: 136/76 133/72 136/83 137/89  Pulse: (!) 110 (!) 123 (!) 112 (!) 113  Resp:      Temp:      TempSrc:      SpO2:    99%  Weight:      Height:       FHR reactive, Category I UCs every 3-4 min  Dilation: 4 Effacement (%): 70 Station: -2 Presentation: Vertex Exam by:: Joesph July, RN

## 2019-05-22 NOTE — Lactation Note (Signed)
This note was copied from a baby's chart. Lactation Consultation Note  Patient Name: Erika Christian [id number] Date: 05/22/2019 Reason for consult: Initial assessment;Early term 37-38.6wks;Primapara;1st time breastfeeding  P1 mother whose infant is now 61 hours old.  This is an ETI at 38+5 weeks weighing just over 6 lbs.  Mother's feeding preference is to pump and bottle feed only.  Mother was doing STS with baby when I arrived.  Offered to initiate the DEBP for her but she did not want to start yet.  Father asked if she could wait until closer to 1800.  I acknowledged his request and suggested he call the RN when mother is ready to begin pumping.  RN has the pump and supplies in the room.  Encouraged mother to continue hand expression and provided colostrum containers.  Milk storage times reviewed and finger feeding demonstrated.  Mother will call for assistance after resting with baby.   Maternal Data Formula Feeding for Exclusion: No Does the patient have breastfeeding experience prior to this delivery?: No  Feeding    LATCH Score                   Interventions    Lactation Tools Discussed/Used     Consult Status Consult Status: Follow-up Date: 05/23/19 Follow-up type: In-patient    Erika Christian 05/22/2019, 5:36 PM

## 2019-05-23 LAB — CBC
HCT: 29.8 % — ABNORMAL LOW (ref 36.0–46.0)
Hemoglobin: 9.8 g/dL — ABNORMAL LOW (ref 12.0–15.0)
MCH: 28.7 pg (ref 26.0–34.0)
MCHC: 32.9 g/dL (ref 30.0–36.0)
MCV: 87.1 fL (ref 80.0–100.0)
Platelets: 234 10*3/uL (ref 150–400)
RBC: 3.42 MIL/uL — ABNORMAL LOW (ref 3.87–5.11)
RDW: 14.9 % (ref 11.5–15.5)
WBC: 18.1 10*3/uL — ABNORMAL HIGH (ref 4.0–10.5)
nRBC: 0 % (ref 0.0–0.2)

## 2019-05-23 LAB — COMPREHENSIVE METABOLIC PANEL
ALT: 13 U/L (ref 0–44)
AST: 23 U/L (ref 15–41)
Albumin: 2.5 g/dL — ABNORMAL LOW (ref 3.5–5.0)
Alkaline Phosphatase: 100 U/L (ref 38–126)
Anion gap: 11 (ref 5–15)
BUN: 6 mg/dL (ref 6–20)
CO2: 18 mmol/L — ABNORMAL LOW (ref 22–32)
Calcium: 7.7 mg/dL — ABNORMAL LOW (ref 8.9–10.3)
Chloride: 109 mmol/L (ref 98–111)
Creatinine, Ser: 0.6 mg/dL (ref 0.44–1.00)
GFR calc Af Amer: >60 mL/min (ref >60)
GFR calc non Af Amer: >60 mL/min (ref >60)
Glucose, Bld: 122 mg/dL — ABNORMAL HIGH (ref 70–99)
Potassium: 3.4 mmol/L — ABNORMAL LOW (ref 3.5–5.1)
Sodium: 138 mmol/L (ref 135–145)
Total Bilirubin: <0.1 mg/dL — ABNORMAL LOW (ref 0.3–1.2)
Total Protein: 5.3 g/dL — ABNORMAL LOW (ref 6.5–8.1)

## 2019-05-23 NOTE — Anesthesia Postprocedure Evaluation (Signed)
Anesthesia Post Note  Patient: Erika Christian  Procedure(s) Performed: AN AD HOC LABOR EPIDURAL     Patient location during evaluation: OB High Risk Anesthesia Type: Epidural Level of consciousness: awake, awake and alert and oriented Pain management: pain level controlled Vital Signs Assessment: post-procedure vital signs reviewed and stable Respiratory status: spontaneous breathing, nonlabored ventilation and respiratory function stable Cardiovascular status: stable Postop Assessment: no headache, no backache, patient able to bend at knees, no apparent nausea or vomiting, adequate PO intake and able to ambulate Anesthetic complications: no    Last Vitals:  Vitals:   05/23/19 0600 05/23/19 0747  BP:  129/88  Pulse:  98  Resp: 18 18  Temp:  36.8 C  SpO2:  100%    Last Pain:  Vitals:   05/23/19 0747  TempSrc: Oral  PainSc:    Pain Goal: Patients Stated Pain Goal: 3 (05/22/19 1935)                 Marcy Siren

## 2019-05-23 NOTE — Lactation Note (Addendum)
This note was copied from a baby's chart. Lactation Consultation Note  Patient Name: Erika Christian [id number] Date: 05/23/2019   Baby 67 hours old.  Mother resting but willing to speak to [facility]. Spoke to RN about changing to higher calorie Neosure and gradually increasing supplemental volume.  Mother wants to only pump and bottle feed. She recently pumped drops.  Discussed hand expression and pumping frequency. Suggest  q 2.5 hours per day and q 4 hrs at night for a minimum of 8 sessions per day.  Mother has personal DEBP in room.  Demonstrated how to convert kit for at home use. Mother will call if further assistance is needed.      Maternal Data    Feeding Feeding Type: Formula  LATCH Score                   Interventions    Lactation Tools Discussed/Used     Consult Status      Carlye Grippe 05/23/2019, 10:43 AM

## 2019-05-23 NOTE — Progress Notes (Signed)
Daily Postpartum Note  Admission Date: 05/21/2019 Current Date: 05/23/2019 9:19 AM  Erika Christian is a 23 y.o. G1P1001 PPD#1 SVD/2nd, admitted for IOL for mild pre-eclampsia.  Pregnancy complicated by: Patient Active Problem List   Diagnosis Date Noted  . SVD (spontaneous vaginal delivery) 05/22/2019  . Hypertension in pregnancy, preeclampsia, severe, delivered 05/22/2019  . Pre-eclampsia 05/21/2019    Overnight/24hr events:  none  Subjective:  No s/s of pre-eclampsia. Meeting all pp goals.   Objective:    Current Vital Signs 24h Vital Sign Ranges  T 98.2 F (36.8 C) Temp  Avg: 98.1 F (36.7 C)  Min: 97.5 F (36.4 C)  Max: 98.7 F (37.1 C)  BP 129/88 BP  Min: 113/68  Max: 177/83  HR 98 Pulse  Avg: 100  Min: 9  Max: 121  RR 18 Resp  Avg: 17.9  Min: 16  Max: 21  SaO2 100 % Room Air SpO2  Avg: 99.6 %  Min: 98 %  Max: 100 %       24 Hour I/O Current Shift I/O  Time Ins Outs 07/16 0701 - 07/17 0700 In: 6044.6 [P.O.:1680; I.V.:4364.6] Out: 2667 [Urine:2400] 07/17 0701 - 07/17 1900 In: 240 [P.O.:240] Out: 200 [Urine:200]   Patient Vitals for the past 24 hrs:  BP Temp Temp src Pulse Resp SpO2  05/23/19 0747 129/88 98.2 F (36.8 C) Oral 98 18 100 %  05/23/19 0600 - - - - 18 -  05/23/19 0500 - - - - 18 -  05/23/19 0400 - - - - 18 -  05/23/19 0351 134/74 (!) 97.5 F (36.4 C) Oral 96 19 99 %  05/23/19 0300 - - - - 18 -  05/23/19 0200 - - - - 17 -  05/23/19 0040 126/64 - - (!) 107 - 98 %  05/22/19 2335 137/73 97.7 F (36.5 C) Oral (!) 102 18 100 %  05/22/19 2227 (!) 143/81 97.9 F (36.6 C) Oral (!) 104 18 100 %  05/22/19 2132 113/68 - - (!) 109 19 100 %  05/22/19 2031 138/64 - - (!) 102 18 99 %  05/22/19 1919 129/81 98.7 F (37.1 C) Oral (!) 103 18 100 %  05/22/19 1811 (!) 147/85 - - 100 19 100 %  05/22/19 1700 (!) 142/77 97.9 F (36.6 C) Axillary (!) 102 (!) 21 99 %  05/22/19 1616 129/86 - - (!) 108 18 -  05/22/19 1601 (!) 161/83 - - 93 18 -  05/22/19 1559  (!) 164/75 - - 87 18 -  05/22/19 1551 (!) 158/77 - - 97 18 -  05/22/19 1547 (!) 177/83 - - 96 17 -  05/22/19 1532 (!) 173/90 - - 98 18 -  05/22/19 1528 - 98.2 F (36.8 C) Oral - - -  05/22/19 1519 (!) 169/80 - - (!) 112 17 -  05/22/19 1504 (!) 166/82 - - (!) 115 18 -  05/22/19 1502 (!) 165/92 - - (!) 121 16 -  05/22/19 1448 (!) 147/73 - - (!) 119 17 -  05/22/19 1445 - - - - - 100 %  05/22/19 1432 (!) 163/86 - - (!) 112 18 -  05/22/19 1420 - - - - - 100 %  05/22/19 1415 - - - - - 100 %  05/22/19 1405 - - - - - 100 %  05/22/19 1345 - 98.2 F (36.8 C) Oral - - -  05/22/19 1332 (!) 156/98 - - (!) 9 - -  05/22/19 1133 - - - -  17 -  05/22/19 1131 136/77 - - 96 17 -  05/22/19 1124 - 98.3 F (36.8 C) Oral - - -  05/22/19 1101 136/70 - - (!) 101 18 -  05/22/19 1045 121/66 - - 96 - -  05/22/19 1001 122/78 - - (!) 113 17 -  05/22/19 0931 129/71 - - (!) 105 18 -   UOP: >129m/hr  Physical exam: General: Well nourished, well developed female in no acute distress. Abdomen: obese, nttp Cardiovascular: S1, S2 normal, no murmur, rub or gallop, regular rate and rhythm Respiratory: CTAB Extremities: no clubbing, cyanosis or edema Skin: Warm and dry.   Medications: Current Facility-Administered Medications  Medication Dose Route Frequency Provider Last Rate Last Dose  . acetaminophen (TYLENOL) tablet 650 mg  650 mg Oral Q4H PRN BJulianne Handler CNM   650 mg at 05/22/19 2238  . benzocaine-Menthol (DERMOPLAST) 20-0.5 % topical spray 1 application  1 application Topical PRN BJulianne Handler CNM      . coconut oil  1 application Topical PRN BJulianne Handler CNM      . witch hazel-glycerin (TUCKS) pad 1 application  1 application Topical PRN BJulianne Handler CNM       And  . dibucaine (NUPERCAINAL) 1 % rectal ointment 1 application  1 application Rectal PRN BJulianne Handler CNM      . diphenhydrAMINE (BENADRYL) capsule 25 mg  25 mg Oral Q6H PRN BJulianne Handler CNM      . ibuprofen  (ADVIL) tablet 600 mg  600 mg Oral Q6H Bhambri, Melanie, CNM   600 mg at 05/23/19 0552  . lactated ringers infusion   Intravenous Continuous PAletha Halim MD 100 mL/hr at 05/23/19 0850    . magnesium sulfate 40 grams in LR 500 mL OB infusion  2 g/hr Intravenous Titrated PAletha Halim MD 25 mL/hr at 05/23/19 0853 2 g/hr at 05/23/19 0853  . measles, mumps & rubella vaccine (MMR) injection 0.5 mL  0.5 mL Subcutaneous Once BJulianne Handler CNM      . ondansetron (ZOFRAN) tablet 4 mg  4 mg Oral Q4H PRN BJulianne Handler CNM       Or  . ondansetron (ZOFRAN) injection 4 mg  4 mg Intravenous Q4H PRN BJulianne Handler CNM      . prenatal multivitamin tablet 1 tablet  1 tablet Oral Q1200 BJulianne Handler CNM      . senna-docusate (Senokot-S) tablet 2 tablet  2 tablet Oral Q24H BJulianne Handler CNM   2 tablet at 05/22/19 2332  . simethicone (MYLICON) chewable tablet 80 mg  80 mg Oral PRN BJulianne Handler CNM      . Tdap (BOOSTRIX) injection 0.5 mL  0.5 mL Intramuscular Once BJulianne Handler CNorth [place]       Labs:  Recent Labs  Lab 05/21/19 1311 05/22/19 0317 05/22/19 1626  WBC 12.8* 15.2* 25.7*  HGB 12.2 11.6* 11.7*  HCT 36.6 34.6* 35.7*  PLT 250 233 234    Recent Labs  Lab 05/21/19 1311  NA 137  K 4.0  CL 107  CO2 18*  BUN 8  CREATININE 0.44  CALCIUM 9.1  PROT 6.7  BILITOT 0.6  ALKPHOS 126  ALT 12  AST 20  GLUCOSE 77     Radiology: none  Assessment & Plan:  Pt doing well *PP: routine care. Breast. O POS. Likely outpatient circ. D/w pt re: BC more tomorrow *Severe pre-eclampsia: Mg until 1500 today. Doing well on no meds. Request sent for 1wk bp check to [place] office *PPx:  scds *FEN/GI: regular diet, mivf *Dispo: likely tomorrow.   Durene Romans MD Attending Center for [facility])

## 2019-05-24 MED ORDER — LABETALOL HCL 200 MG PO TABS
200.0000 mg | ORAL_TABLET | Freq: Two times a day (BID) | ORAL | Status: DC
  Administered 2019-05-24: 200 mg via ORAL
  Filled 2019-05-24 (×2): qty 1

## 2019-05-24 MED ORDER — IBUPROFEN 600 MG PO TABS: 600.0000 mg | ORAL_TABLET | Freq: Four times a day (QID) | ORAL | 0 refills | Status: DC

## 2019-05-24 MED ORDER — NIFEDIPINE ER OSMOTIC RELEASE 30 MG PO TB24: 30.0000 mg | ORAL_TABLET | Freq: Every day | ORAL | Status: DC

## 2019-05-24 MED ORDER — LABETALOL HCL 200 MG PO TABS: 200.0000 mg | ORAL_TABLET | Freq: Two times a day (BID) | ORAL | 2 refills | Status: DC

## 2019-05-24 NOTE — Lactation Note (Signed)
This note was copied from a baby's chart. Lactation Consultation Note  Patient Name: Erika Christian [id number] Date: 05/24/2019 Reason for consult: Follow-up assessment;Infant weight loss;Early term 42-38.6wks  45 hours old ETI female who is being mostly bottle fed by his mother at this point, mom told LC that she's been pumping too but not every 3 hours. Mom and baby are going home today, baby is at 4% weigh loss. Reviewed discharge instructions, engorgement prevention and treatment and treatment/prevention for sore nipples. Parents reported all questions and concerns were answered, they're both aware of [facility] OP services and will contact PRN.  Maternal Data    Feeding Feeding Type: Bottle Fed - Formula Nipple Type: Slow - flow   Interventions Interventions: Breast feeding basics reviewed  Lactation Tools Discussed/Used     Consult Status Consult Status: Complete Date: 05/24/19 Follow-up type: Call as needed    [name] Francene Boyers 05/24/2019, 11:53 AM

## 2019-05-24 NOTE — Progress Notes (Signed)
Patient discharged with printed instructions. Pt verbalized an understanding. No concerns noted. Rogen Porte L Denise Bramblett, RN 

## 2019-05-29 ENCOUNTER — Telehealth: Payer: Self-pay | Admitting: Family Medicine

## 2019-05-29 NOTE — Telephone Encounter (Signed)
Attempted to call patient about her appointment on 7/24 @ 8:35. No answer, left voicemail instructing patient to wear a face mask for the entire appointment. Patient instructed that no visitors will be allowed during the visit. Patient instructed not to attend the appointment if she has any symptoms. Symptom list and office number left.

## 2019-05-30 ENCOUNTER — Ambulatory Visit (INDEPENDENT_AMBULATORY_CARE_PROVIDER_SITE_OTHER): Payer: Medicaid Other | Admitting: *Deleted

## 2019-05-30 ENCOUNTER — Other Ambulatory Visit: Payer: Self-pay

## 2019-05-30 VITALS — BP 150/94 | HR 70 | Temp 99.2°F | Ht <= 58 in | Wt 194.7 lb

## 2019-05-30 DIAGNOSIS — O1414 Severe pre-eclampsia complicating childbirth: Secondary | ICD-10-CM

## 2019-05-30 MED ORDER — LABETALOL HCL 200 MG PO TABS: 400.0000 mg | ORAL_TABLET | Freq: Two times a day (BID) | ORAL | 2 refills | Status: DC

## 2019-05-30 NOTE — Progress Notes (Signed)
Patient seen and assessed by nursing staff during this encounter. I have reviewed the chart and agree with the documentation and plan.  Ugonna Anyanwu, MD 05/30/2019 12:39 PM    

## 2019-05-30 NOTE — Progress Notes (Addendum)
Pt presents for BP check as scheduled. She had NSVD on 7/16 and developed PP severe pre-eclampsia, treated with MgSo4 during hospital stay. She was d/c from hospital on 7/18.  Pt denies having H/A or visual disturbances. BP values 159/102 and 150/94 were discussed with Dr. Harolyn Rutherford who advised to increase labetalol to 400 mg BID and return in 1 week for BP check.  Pt advised of new labetalol dose and plan of care. She agreed and voiced understanding.

## 2019-06-06 ENCOUNTER — Other Ambulatory Visit: Payer: Self-pay

## 2019-06-06 ENCOUNTER — Ambulatory Visit (INDEPENDENT_AMBULATORY_CARE_PROVIDER_SITE_OTHER): Payer: Medicaid Other

## 2019-06-06 VITALS — BP 152/102 | Wt 188.3 lb

## 2019-06-06 DIAGNOSIS — Z0130 Encounter for examination of blood pressure without abnormal findings: Secondary | ICD-10-CM

## 2019-06-06 NOTE — Progress Notes (Signed)
Patient seen and assessed by nursing staff during this encounter. I have reviewed the chart and agree with the documentation and plan.  Verita Schneiders, MD 06/06/2019 3:09 PM

## 2019-06-06 NOTE — Progress Notes (Signed)
Pt here today for BP check from increase of dosage of Labetolol to 400 mg po bid.  Pt denies any sx's of HTN. Pt BP 157/110 RA via dynamap and 152/102 LA via manually.  Pt reports that she has not taken her dose of Labetalol this am with her last dose being last night.  Notified Dr. Harolyn Rutherford who recommended that the pt take her BP medication and to continue to monitor for sx's of HTN.  If she starts to have any of those sx's to please go to MAU.  Pt verbalized understanding with no further questions.   Mel Almond, RN 06/06/19

## 2019-06-17 ENCOUNTER — Telehealth: Payer: Medicaid Other | Admitting: Family

## 2019-06-17 DIAGNOSIS — R112 Nausea with vomiting, unspecified: Secondary | ICD-10-CM

## 2019-06-17 MED ORDER — ONDANSETRON HCL 8 MG PO TABS: 8.0000 mg | ORAL_TABLET | Freq: Two times a day (BID) | ORAL | 0 refills | Status: DC

## 2019-06-17 MED ORDER — PROMETHAZINE HCL 25 MG PO TABS: 25.0000 mg | ORAL_TABLET | Freq: Three times a day (TID) | ORAL | 0 refills | Status: DC | PRN

## 2019-06-17 NOTE — Addendum Note (Signed)
Addended by: Dutch Quint B on: 06/17/2019 11:48 AM   Modules accepted: Orders

## 2019-06-17 NOTE — Progress Notes (Signed)
We are sorry that you are not feeling well. Here is how we plan to help!  Based on what you have shared with me it looks like you have a Virus that is irritating your GI tract.  Vomiting is the forceful emptying of a portion of the stomach's content through the mouth.  Although nausea and vomiting can make you feel miserable, it's important to remember that these are not diseases, but rather symptoms of an underlying illness.  When we treat short term symptoms, we always caution that any symptoms that persist should be fully evaluated in a medical office.  I have prescribed a medication that will help alleviate your symptoms and allow you to stay hydrated:  Promethazine 25 mg take 1 tablet twice daily  HOME CARE:  Drink clear liquids.  This is very important! Dehydration (the lack of fluid) can lead to a serious complication.  Start off with 1 tablespoon every 5 minutes for 8 hours.  You may begin eating bland foods after 8 hours without vomiting.  Start with saltine crackers, white bread, rice, mashed potatoes, applesauce.  After 48 hours on a bland diet, you may resume a normal diet.  Try to go to sleep.  Sleep often empties the stomach and relieves the need to vomit.  GET HELP RIGHT AWAY IF:   Your symptoms do not improve or worsen within 2 days after treatment.  You have a fever for over 3 days.  You cannot keep down fluids after trying the medication.  MAKE SURE YOU:   Understand these instructions.  Will watch your condition.  Will get help right away if you are not doing well or get worse.   Thank you for choosing an e-visit. Your e-visit answers were reviewed by a board certified advanced clinical practitioner to complete your personal care plan. Depending upon the condition, your plan could have included both over the counter or prescription medications. Please review your pharmacy choice. Be sure that the pharmacy you have chosen is open so that you can pick up your  prescription now.  If there is a problem you may message your provider in MyChart to have the prescription routed to another pharmacy. Your safety is important to us. If you have drug allergies check your prescription carefully.  For the next 24 hours, you can use MyChart to ask questions about today's visit, request a non-urgent call back, or ask for a work or school excuse from your e-visit provider. You will get an e-mail in the next two days asking about your experience. I hope that your e-visit has been valuable and will speed your recovery.  Greater than 5 minutes, yet less than 10 minutes of time have been spent researching, coordinating, and implementing care for this patient today.  Thank you for the details you included in the comment boxes. Those details are very helpful in determining the best course of treatment for you and help us to provide the best care.   

## 2019-06-24 ENCOUNTER — Other Ambulatory Visit: Payer: Self-pay

## 2019-06-24 ENCOUNTER — Encounter: Payer: Self-pay | Admitting: Family Medicine

## 2019-06-24 ENCOUNTER — Telehealth: Payer: Medicaid Other

## 2019-06-24 ENCOUNTER — Ambulatory Visit (INDEPENDENT_AMBULATORY_CARE_PROVIDER_SITE_OTHER): Payer: Medicaid Other | Admitting: Emergency Medicine

## 2019-06-24 VITALS — BP 130/84 | HR 90 | Wt 185.2 lb

## 2019-06-24 DIAGNOSIS — Z87440 Personal history of urinary (tract) infections: Secondary | ICD-10-CM | POA: Diagnosis not present

## 2019-06-24 DIAGNOSIS — Z91199 Patient's noncompliance with other medical treatment and regimen due to unspecified reason: Secondary | ICD-10-CM

## 2019-06-24 DIAGNOSIS — R82998 Other abnormal findings in urine: Secondary | ICD-10-CM | POA: Diagnosis not present

## 2019-06-24 DIAGNOSIS — Z5329 Procedure and treatment not carried out because of patient's decision for other reasons: Secondary | ICD-10-CM

## 2019-06-24 LAB — POCT URINALYSIS DIP (DEVICE)
Bilirubin Urine: NEGATIVE
Glucose, UA: NEGATIVE mg/dL
Ketones, ur: NEGATIVE mg/dL
Nitrite: NEGATIVE
Protein, ur: 30 mg/dL — AB
Specific Gravity, Urine: 1.015 (ref 1.005–1.030)
Urobilinogen, UA: 0.2 mg/dL (ref 0.0–1.0)
pH: 6 (ref 5.0–8.0)

## 2019-06-24 NOTE — Progress Notes (Signed)
Pt here today for BP check. Pt reports taking her labetolol 200 mg as directed but has not had it yet today. Pt BP 130/84 today and pt denies headache, dizziness, or changes in vision. Pt instructed to keep taking her medication as directed and follow up at her postpartum appointment.   Pt requested a urinalysis to determine if her previous UTI had resolved. Pt denies any urinary symptoms. Per Dr. Kennon Rounds, pt urinalysis to be sent for urine culture.

## 2019-06-24 NOTE — Progress Notes (Signed)
Unable to reach patient for virtual visit.   Wende Mott, CNM 06/24/19 3:14 PM

## 2019-06-24 NOTE — Progress Notes (Signed)
@  235pm lvm for appointment today advised will call back in 10 mins for appointment @250pm  lvm to reschedule appointment

## 2019-06-24 NOTE — Progress Notes (Signed)
Patient seen and assessed by nursing staff.  Agree with documentation and plan.  

## 2019-09-30 ENCOUNTER — Other Ambulatory Visit: Payer: Self-pay

## 2019-09-30 DIAGNOSIS — Z20828 Contact with and (suspected) exposure to other viral communicable diseases: Secondary | ICD-10-CM | POA: Diagnosis not present

## 2019-09-30 DIAGNOSIS — Z20822 Contact with and (suspected) exposure to covid-19: Secondary | ICD-10-CM

## 2019-10-01 LAB — NOVEL CORONAVIRUS, NAA: SARS-CoV-2, NAA: NOT DETECTED

## 2020-08-11 DIAGNOSIS — R252 Cramp and spasm: Secondary | ICD-10-CM | POA: Diagnosis not present

## 2020-08-11 DIAGNOSIS — R519 Headache, unspecified: Secondary | ICD-10-CM | POA: Diagnosis not present

## 2020-08-11 DIAGNOSIS — M79604 Pain in right leg: Secondary | ICD-10-CM | POA: Diagnosis not present

## 2020-08-11 DIAGNOSIS — Z8249 Family history of ischemic heart disease and other diseases of the circulatory system: Secondary | ICD-10-CM | POA: Diagnosis not present

## 2020-08-27 DIAGNOSIS — M79604 Pain in right leg: Secondary | ICD-10-CM | POA: Diagnosis not present

## 2020-12-13 DIAGNOSIS — Z20822 Contact with and (suspected) exposure to covid-19: Secondary | ICD-10-CM | POA: Diagnosis not present

## 2021-04-12 ENCOUNTER — Encounter (HOSPITAL_COMMUNITY): Payer: Self-pay | Admitting: Emergency Medicine

## 2021-04-12 ENCOUNTER — Ambulatory Visit (HOSPITAL_COMMUNITY)
Admission: EM | Admit: 2021-04-12 | Discharge: 2021-04-12 | Disposition: A | Discharge status: Home / Self Care | Payer: Medicaid Other

## 2021-04-12 ENCOUNTER — Other Ambulatory Visit: Payer: Self-pay

## 2021-04-12 DIAGNOSIS — R110 Nausea: Secondary | ICD-10-CM

## 2021-04-12 DIAGNOSIS — G43019 Migraine without aura, intractable, without status migrainosus: Secondary | ICD-10-CM | POA: Diagnosis not present

## 2021-04-12 DIAGNOSIS — R0789 Other chest pain: Secondary | ICD-10-CM

## 2021-04-12 HISTORY — DX: Unspecified pre-eclampsia, unspecified trimester: O14.90

## 2021-04-12 MED ORDER — KETOROLAC TROMETHAMINE 30 MG/ML IJ SOLN
INTRAMUSCULAR | Status: AC
  Filled 2021-04-12: qty 1

## 2021-04-12 MED ORDER — DEXAMETHASONE SODIUM PHOSPHATE 10 MG/ML IJ SOLN
INTRAMUSCULAR | Status: AC
  Filled 2021-04-12: qty 1

## 2021-04-12 MED ORDER — NAPROXEN 500 MG PO TABS: 500.0000 mg | ORAL_TABLET | Freq: Two times a day (BID) | ORAL | 0 refills | Status: DC

## 2021-04-12 MED ORDER — KETOROLAC TROMETHAMINE 30 MG/ML IJ SOLN: 30.0000 mg | Freq: Once | INTRAMUSCULAR | Status: DC

## 2021-04-12 MED ORDER — DEXAMETHASONE SODIUM PHOSPHATE 10 MG/ML IJ SOLN: 10.0000 mg | Freq: Once | INTRAMUSCULAR | Status: DC

## 2021-04-12 MED ORDER — PREDNISONE 20 MG PO TABS: 40.0000 mg | ORAL_TABLET | Freq: Every day | ORAL | 0 refills | Status: DC

## 2021-04-12 NOTE — ED Provider Notes (Addendum)
[facility]    CSN: [id number] Arrival date & time: 04/12/21  1412      History   Chief Complaint Chief Complaint  Patient presents with  . Chest Pain   HPI Erika Christian is a 25 y.o. female.   Patient presenting today with several day history of migraine on the left side of her forehead.  States she is currently being worked up for migraines by her PCP, awaiting MRI on Friday and recently got Imitrex.  Has rarely taken any but took some yesterday for this migraine and afterwards started noticing chest tightness and just overall feeling funny.  She denies shortness of breath, dizziness, visual changes, abdominal pain, nausea vomiting or diarrhea, mental status changes.  Was tried over-the-counter pain relievers with mild temporary relief of her migraine.  LMP 04/02/2021.     Past Medical History:  Diagnosis Date  . Medical history non-contributory   . Preeclampsia     Patient Active Problem List   Diagnosis Date Noted  . SVD (spontaneous vaginal delivery) 05/22/2019  . Hypertension in pregnancy, preeclampsia, severe, delivered 05/22/2019  . Pre-eclampsia 05/21/2019    Past Surgical History:  Procedure Laterality Date  . APPENDECTOMY      OB History    Gravida  1   Para  1   Term  1   Preterm  0   AB  0   Living  1     SAB  0   IAB  0   Ectopic  0   Multiple  0   Live Births  1            Home Medications    Prior to Admission medications   Medication Sig Start Date End Date Taking? Authorizing Provider  naproxen (NAPROSYN) 500 MG tablet Take 1 tablet (500 mg total) by mouth 2 (two) times daily. 04/12/21  Yes Particia Nearing, PA-C  predniSONE (DELTASONE) 20 MG tablet Take 2 tablets (40 mg total) by mouth daily with breakfast. 04/12/21  Yes Particia Nearing, PA-C  SUMAtriptan (IMITREX) 50 MG tablet Take by mouth. 03/21/21  Yes [provider]  ibuprofen (ADVIL) 200 MG tablet Take 200 mg by mouth every 4 (four)  hours as needed.    [provider]  ibuprofen (ADVIL) 600 MG tablet Take 1 tablet (600 mg total) by mouth every 6 (six) hours. 05/24/19   Conan Bowens, MD  labetalol (NORMODYNE) 200 MG tablet Take 2 tablets (400 mg total) by mouth 2 (two) times daily. Patient not taking: Reported on 04/12/2021 05/30/19   Anyanwu, Jethro Bastos, MD  ondansetron (ZOFRAN) 8 MG tablet Take 1 tablet (8 mg total) by mouth 2 (two) times daily. Patient not taking: Reported on 06/24/2019 06/17/19   Eulis Foster, FNP  Prenatal Vit-Fe Fumarate-FA (PRENATAL MULTIVITAMIN) TABS tablet Take 1 tablet by mouth daily at 12 noon. Patient not taking: Reported on 04/12/2021    [provider]    Family History History reviewed. No pertinent family history.  Social History Social History   Tobacco Use  . Smoking status: Never Smoker  . Smokeless tobacco: Never Used  Vaping Use  . Vaping Use: Never used  Substance Use Topics  . Alcohol use: Not Currently    Comment: occasionally  . Drug use: No     Allergies   Orange fruit [citrus]   Review of Systems Review of Systems Per HPI  Physical Exam Triage Vital Signs ED Triage Vitals  Enc Vitals  Group     BP 04/12/21 1429 133/82     Pulse Rate 04/12/21 1429 (!) 102     Resp 04/12/21 1429 18     Temp 04/12/21 1429 99.1 F (37.3 C)     Temp Source 04/12/21 1429 Oral     SpO2 04/12/21 1429 100 %     Weight --      Height --      Head Circumference --      Peak Flow --      Pain Score 04/12/21 1423 7     Pain Loc --      Pain Edu? --      Excl. in GC? --    No data found.  Updated Vital Signs BP 133/82 (BP Location: Right Arm) Comment (BP Location): large cuff  Pulse 94   Temp 99.1 F (37.3 C) (Oral)   Resp 18   LMP 04/02/2021   SpO2 100%   Visual Acuity Right Eye Distance:   Left Eye Distance:   Bilateral Distance:    Right Eye Near:   Left Eye Near:    Bilateral Near:     Physical Exam Vitals and nursing note reviewed.   Constitutional:      Appearance: Normal appearance. She is not ill-appearing.  HENT:     Head: Atraumatic.     Nose: Nose normal.     Mouth/Throat:     Mouth: Mucous membranes are moist.     Pharynx: Oropharynx is clear.  Eyes:     Extraocular Movements: Extraocular movements intact.     Conjunctiva/sclera: Conjunctivae normal.  Cardiovascular:     Rate and Rhythm: Normal rate and regular rhythm.     Heart sounds: Normal heart sounds.  Pulmonary:     Effort: Pulmonary effort is normal.     Breath sounds: Normal breath sounds.  Abdominal:     General: Bowel sounds are normal. There is no distension.     Palpations: Abdomen is soft.     Tenderness: There is no abdominal tenderness. There is no guarding.  Musculoskeletal:        General: Normal range of motion.     Cervical back: Normal range of motion and neck supple.  Skin:    General: Skin is warm and dry.     Findings: No rash.  Neurological:     General: No focal deficit present.     Mental Status: She is alert and oriented to person, place, and time.     Cranial Nerves: No cranial nerve deficit.     Sensory: No sensory deficit.     Motor: No weakness.     Gait: Gait normal.     Deep Tendon Reflexes: Reflexes normal.  Psychiatric:        Mood and Affect: Mood normal.        Thought Content: Thought content normal.        Judgment: Judgment normal.      UC Treatments / Results  Labs (all labs ordered are listed, but only abnormal results are displayed) Labs Reviewed - No data to display  EKG   Radiology No results found.  Procedures Procedures (including critical care time)  Medications Ordered in UC Medications  ketorolac (TORADOL) 30 MG/ML injection 30 mg (30 mg Intramuscular Not Given 04/12/21 1538)  dexamethasone (DECADRON) injection 10 mg (10 mg Intramuscular Not Given 04/12/21 1537)    Initial Impression / Assessment and Plan / UC Course  I have reviewed the  triage vital signs and the nursing  notes.  Pertinent labs & imaging results that were available during my care of the patient were reviewed by me and considered in my medical decision making (see chart for details).     Exam and vital signs very reassuring today, no neurologic deficits on exam.  Suspect side effect to Imitrex causing her nonmigraine symptoms at this time.  EKG today sinus rhythm with no acute ST or T wave changes.  She will DC this medication until she talks to her prescribing provider, and meantime offered Decadron and Toradol which she was initially agreeable to but upon time of administration she declines these and wishes for an oral option instead.  Prednisone and naproxen then sent to the pharmacy, return for acutely worsening symptoms  Final Clinical Impressions(s) / UC Diagnoses   Final diagnoses:  Intractable migraine without aura and without status migrainosus  Chest tightness  Nausea without vomiting   Discharge Instructions   None    ED Prescriptions    Medication Sig Dispense Auth. Provider   predniSONE (DELTASONE) 20 MG tablet Take 2 tablets (40 mg total) by mouth daily with breakfast. 10 tablet Particia Nearing, PA-C   naproxen (NAPROSYN) 500 MG tablet Take 1 tablet (500 mg total) by mouth 2 (two) times daily. 30 tablet Particia Nearing, [place]     PDMP not reviewed this encounter.   Particia Nearing, PA-C 04/12/21 1720    Particia Nearing, [place] 04/12/21 1720

## 2021-04-12 NOTE — ED Triage Notes (Signed)
Patient reports she has migraines.  Her pcp has prescribed sumatriptan.  Patient took this medicine last night. Patient noticed chest tightness this morning.  migrain did not go away

## 2021-09-15 ENCOUNTER — Other Ambulatory Visit: Payer: Self-pay | Admitting: Family Medicine

## 2021-09-15 NOTE — Telephone Encounter (Signed)
Requested Prescriptions  Refused Prescriptions Disp Refills  . naproxen (NAPROSYN) 500 MG tablet [Pharmacy Med Name: NAPROXEN 500 MG TABLET] 30 tablet 0    Sig: TAKE 1 TABLET BY MOUTH TWICE A DAY     Analgesics:  NSAIDS Failed - 09/15/2021  9:53 AM      Failed - Cr in normal range and within 360 days    Creatinine, Ser  Date Value Ref Range Status  05/23/2019 0.60 0.44 - 1.00 mg/dL Final   Creatinine, Urine  Date Value Ref Range Status  05/21/2019 107.14 mg/dL Final         Failed - HGB in normal range and within 360 days    Hemoglobin  Date Value Ref Range Status  05/23/2019 9.8 (L) 12.0 - 15.0 g/dL Final         Failed - Valid encounter within last 12 months    Recent Outpatient Visits   None            Passed - Patient is not pregnant

## 2021-10-03 ENCOUNTER — Telehealth: Payer: Medicaid Other | Admitting: Physician Assistant

## 2021-10-03 DIAGNOSIS — R6889 Other general symptoms and signs: Secondary | ICD-10-CM

## 2021-10-04 MED ORDER — BENZONATATE 100 MG PO CAPS: 100.0000 mg | ORAL_CAPSULE | Freq: Three times a day (TID) | ORAL | 0 refills | Status: DC | PRN

## 2021-10-04 NOTE — Progress Notes (Signed)
E visit for Flu like symptoms   We are sorry that you are not feeling well.  Here is how we plan to help! Based on what you have shared with me it looks like you may have flu-like symptoms that should be watched but do not seem to indicate anti-viral treatment.  Influenza or "the flu" is   an infection caused by a respiratory virus. The flu virus is highly contagious and persons who did not receive their yearly flu vaccination may "catch" the flu from close contact.  We have anti-viral medications to treat the viruses that cause this infection. They are not a "cure" and only shorten the course of the infection. These prescriptions are most effective when they are given within the first 2 days of "flu" symptoms. Antiviral medication are indicated if you have a high risk of complications from the flu. You should  also consider an antiviral medication if you are in close contact with someone who is at risk. These medications can help patients avoid complications from the flu  but have side effects that you should know. Possible side effects from Tamiflu or oseltamivir include nausea, vomiting, diarrhea, dizziness, headaches, eye redness, sleep problems or other respiratory symptoms. You should not take Tamiflu if you have an allergy to oseltamivir or any to the ingredients in Tamiflu.  Based upon your symptoms and potential risk factors I recommend that you follow the flu symptoms recommendation that I have listed below.  I have prescribed tessalon perles for the cough.  ANYONE WHO HAS FLU SYMPTOMS SHOULD: Stay home. The flu is highly contagious and going out or to work exposes others! Be sure to drink plenty of fluids. Water is fine as well as fruit juices, sodas and electrolyte beverages. You may want to stay away from caffeine or alcohol. If you are nauseated, try taking small sips of liquids. How do you know if you are getting enough fluid? Your urine should be a pale yellow or almost  colorless. Get rest. Taking a steamy shower or using a humidifier may help nasal congestion and ease sore throat pain. Using a saline nasal spray works much the same way. Cough drops, hard candies and sore throat lozenges may ease your cough. Line up a caregiver. Have someone check on you regularly.   GET HELP RIGHT AWAY IF: You cannot keep down liquids or your medications. You become short of breath Your fell like you are going to pass out or loose consciousness. Your symptoms persist after you have completed your treatment plan MAKE SURE YOU  Understand these instructions. Will watch your condition. Will get help right away if you are not doing well or get worse.  Your e-visit answers were reviewed by a board certified advanced clinical practitioner to complete your personal care plan.  Depending on the condition, your plan could have included both over the counter or prescription medications.  If there is a problem please reply  once you have received a response from your provider.  Your safety is important to [place].  If you have drug allergies check your prescription carefully.    You can use MyChart to ask questions about today's visit, request a non-urgent call back, or ask for a work or school excuse for 24 hours related to this e-Visit. If it has been greater than 24 hours you will need to follow up with your provider, or enter a new e-Visit to address those concerns.  You will get an e-mail in the next two  days asking about your experience.  I hope that your e-visit has been valuable and will speed your recovery. Thank you for using e-visits.  I provided 6 minutes of non face-to-face time during this encounter for chart review and documentation.

## 2021-10-17 ENCOUNTER — Ambulatory Visit: Payer: Medicaid Other

## 2021-10-24 ENCOUNTER — Other Ambulatory Visit: Payer: Self-pay

## 2021-10-24 ENCOUNTER — Ambulatory Visit (INDEPENDENT_AMBULATORY_CARE_PROVIDER_SITE_OTHER): Payer: Medicaid Other | Admitting: *Deleted

## 2021-10-24 VITALS — BP 124/72 | HR 106 | Temp 98.1°F | Ht 59.0 in | Wt 193.4 lb

## 2021-10-24 DIAGNOSIS — Z3201 Encounter for pregnancy test, result positive: Secondary | ICD-10-CM | POA: Diagnosis not present

## 2021-10-24 DIAGNOSIS — O0990 Supervision of high risk pregnancy, unspecified, unspecified trimester: Secondary | ICD-10-CM | POA: Insufficient documentation

## 2021-10-24 DIAGNOSIS — Z3480 Encounter for supervision of other normal pregnancy, unspecified trimester: Secondary | ICD-10-CM

## 2021-10-24 LAB — POCT URINE PREGNANCY: Preg Test, Ur: POSITIVE — AB

## 2021-10-24 MED ORDER — BLOOD PRESSURE MONITOR AUTOMAT DEVI: 1.0000 | Freq: Every day | 0 refills | Status: DC

## 2021-10-24 NOTE — Patient Instructions (Signed)
    Please remember that if you are not able to keep your appointment to call the office and reschedule, or cancel. If you no-show or cancel with less than 24 hour notice we will charge you, not your insurance a $50 fee.  

## 2021-10-24 NOTE — Progress Notes (Signed)
° ° °  I discussed the limitations, risks, security and privacy concerns of performing an evaluation and management service by telephone and the availability of in person appointments. I also discussed with the patient that there may be a patient responsible charge related to this service. The patient expressed understanding and agreed to proceed.   Location: [facility] Patient: clinic Provider: clinic Interpreter used: no professional interpreter  PRENATAL INTAKE SUMMARY  Ms. Rotenberg presents today New OB Nurse Interview.  OB History     Gravida  2   Para  1   Term  1   Preterm  0   AB  0   Living  1      SAB  0   IAB  0   Ectopic  0   Multiple  0   Live Births  1          I have reviewed the patient's medical, obstetrical, social, and family histories, medications, and available lab results.  SUBJECTIVE She has no unusual complaints  OBJECTIVE Initial Nurse interview for history/labs (New OB)  last menstrual period date: 09/19/2021 EDD: 06/26/2022 by LMP GA: [redacted]w[redacted]d G2P1001 FHT: not assessed due to gestational age  GENERAL APPEARANCE: alert, well appearing, in no apparent distress, oriented to person, place and time, overweight   ASSESSMENT Positive urine pregnancy test Normal pregnancy  PLAN Prenatal care:  [facility] to be [facility] at [facility] appointment  Blood Pressure Monitor/Weight Scale BP Rx sent to [facility] Weight Scale home  MyChart/Babyscripts MyChart access verified. I explained pt will have some visits in office and some virtually. Babyscripts instructions given and order placed.   Placed OB Box on problem list and updated   Followup with Camelia Eng, CNM APP 12/02/2020 at  9:15 AM  Follow Up Instructions:   I discussed the assessment and treatment plan with the patient. The patient was provided an opportunity to ask questions and all were answered. The patient agreed with the plan and demonstrated an  understanding of the instructions.   The patient was advised to call back or seek an in-person evaluation if the symptoms worsen or if the condition fails to improve as anticipated.  I provided 30 minutes of  face-to-face time during this encounter.  Clovis Pu, RN

## 2021-11-06 NOTE — L&D Delivery Note (Signed)
Erika Christian is a 26 y.o. female G2P1001 with IUP at [redacted]w[redacted]d admitted for **** .  She progressed with/without augmentation to complete and pushed **** to deliver.  Cord clamping delayed by several minutes then clamped by CNM and cut by ***.  Placenta intact and spontaneous, bleeding minimal.  ***** laceration repaired without difficulty.  Mom and baby stable prior to transfer to postpartum. She plans on breastfeeding. She requests *** for birth control.   LABOR COURSE ***  Delivery Note Called to room and patient was complete and pushing. Head delivered ***. No nuchal cord present***. Shoulder and body delivered in usual fashion. At  *** a {baby status:3041425} **** (female/female) was delivered via Vaginal, Spontaneous (Presentation: ;  ).  Infant with spontaneous cry, placed on mother's abdomen, dried and stimulated. Cord clamped x 2 after ***-minute delay, and cut by ***. Cord blood drawn. Placenta delivered spontaneously with gentle cord traction. Appears intact. Fundus firm with massage and Pitocin. Labia, perineum, vagina, and cervix inspected with ***.    APGAR: , ; weight  .   Cord: 3VC with the following complications:***.   Cord pH: ***  Anesthesia:   Episiotomy: None Lacerations:  Suture Repair: {suture JXBJ:4782956} Est. Blood Loss (mL): ***  Mom to {mom status:3041454}.  Baby to {baby status:3041455}.  Hoyte Ziebell, Nadene Rubins, MD @TODAY @ 10:11 PM

## 2021-11-30 ENCOUNTER — Encounter (HOSPITAL_BASED_OUTPATIENT_CLINIC_OR_DEPARTMENT_OTHER): Payer: Medicaid Other | Admitting: Obstetrics & Gynecology

## 2021-11-30 ENCOUNTER — Encounter (HOSPITAL_BASED_OUTPATIENT_CLINIC_OR_DEPARTMENT_OTHER): Payer: Medicaid Other

## 2021-12-08 ENCOUNTER — Ambulatory Visit (INDEPENDENT_AMBULATORY_CARE_PROVIDER_SITE_OTHER): Payer: Medicaid Other

## 2021-12-08 ENCOUNTER — Ambulatory Visit (INDEPENDENT_AMBULATORY_CARE_PROVIDER_SITE_OTHER): Payer: Medicaid Other | Admitting: *Deleted

## 2021-12-08 ENCOUNTER — Other Ambulatory Visit: Payer: Self-pay

## 2021-12-08 ENCOUNTER — Encounter (HOSPITAL_BASED_OUTPATIENT_CLINIC_OR_DEPARTMENT_OTHER): Payer: Self-pay | Admitting: Obstetrics & Gynecology

## 2021-12-08 ENCOUNTER — Ambulatory Visit (INDEPENDENT_AMBULATORY_CARE_PROVIDER_SITE_OTHER): Payer: Medicaid Other | Admitting: Obstetrics & Gynecology

## 2021-12-08 ENCOUNTER — Other Ambulatory Visit (HOSPITAL_COMMUNITY)
Admission: RE | Admit: 2021-12-08 | Discharge: 2021-12-08 | Disposition: A | Discharge status: Home / Self Care | Payer: Medicaid Other | Source: Ambulatory Visit | Attending: Obstetrics & Gynecology | Admitting: Obstetrics & Gynecology

## 2021-12-08 VITALS — BP 127/54 | HR 95 | Wt 198.6 lb

## 2021-12-08 DIAGNOSIS — O0990 Supervision of high risk pregnancy, unspecified, unspecified trimester: Secondary | ICD-10-CM

## 2021-12-08 DIAGNOSIS — Z3A11 11 weeks gestation of pregnancy: Secondary | ICD-10-CM

## 2021-12-08 DIAGNOSIS — O2341 Unspecified infection of urinary tract in pregnancy, first trimester: Secondary | ICD-10-CM | POA: Diagnosis not present

## 2021-12-08 DIAGNOSIS — N390 Urinary tract infection, site not specified: Secondary | ICD-10-CM | POA: Insufficient documentation

## 2021-12-08 DIAGNOSIS — O98511 Other viral diseases complicating pregnancy, first trimester: Secondary | ICD-10-CM | POA: Insufficient documentation

## 2021-12-08 DIAGNOSIS — Z3481 Encounter for supervision of other normal pregnancy, first trimester: Secondary | ICD-10-CM

## 2021-12-08 DIAGNOSIS — O09891 Supervision of other high risk pregnancies, first trimester: Secondary | ICD-10-CM

## 2021-12-08 DIAGNOSIS — Z2839 Other underimmunization status: Secondary | ICD-10-CM

## 2021-12-08 DIAGNOSIS — Z8759 Personal history of other complications of pregnancy, childbirth and the puerperium: Secondary | ICD-10-CM | POA: Insufficient documentation

## 2021-12-08 DIAGNOSIS — B069 Rubella without complication: Secondary | ICD-10-CM | POA: Insufficient documentation

## 2021-12-08 DIAGNOSIS — O0991 Supervision of high risk pregnancy, unspecified, first trimester: Secondary | ICD-10-CM | POA: Insufficient documentation

## 2021-12-08 DIAGNOSIS — Z3480 Encounter for supervision of other normal pregnancy, unspecified trimester: Secondary | ICD-10-CM

## 2021-12-08 DIAGNOSIS — O09899 Supervision of other high risk pregnancies, unspecified trimester: Secondary | ICD-10-CM

## 2021-12-08 DIAGNOSIS — O3680X1 Pregnancy with inconclusive fetal viability, fetus 1: Secondary | ICD-10-CM

## 2021-12-08 DIAGNOSIS — Z6841 Body Mass Index (BMI) 40.0 and over, adult: Secondary | ICD-10-CM

## 2021-12-08 LAB — POCT URINALYSIS DIPSTICK OB
Bilirubin, UA: NEGATIVE
Glucose, UA: NEGATIVE
Ketones, UA: NEGATIVE
Nitrite, UA: NEGATIVE
POC,PROTEIN,UA: NEGATIVE
Spec Grav, UA: 1.015 (ref 1.010–1.025)
Urobilinogen, UA: 1 E.U./dL
pH, UA: 7 (ref 5.0–8.0)

## 2021-12-08 LAB — HEPATITIS C ANTIBODY: HCV Ab: NEGATIVE

## 2021-12-08 MED ORDER — ASPIRIN 81 MG PO TBEC: 81.0000 mg | DELAYED_RELEASE_TABLET | Freq: Every day | ORAL | 12 refills | Status: DC

## 2021-12-08 MED ORDER — BLOOD PRESSURE KIT DEVI: 1.0000 | Freq: Once | 0 refills | Status: AC

## 2021-12-08 NOTE — Progress Notes (Deleted)
History:   Erika Christian is a 26 y.o. G2P1001 at [redacted]w[redacted]d by LMP being seen today for her first obstetrical visit.  Her obstetrical history is significant for {ob risk factors:10154}. Patient {does/does not:19097} intend to breast feed. Pregnancy history fully reviewed.  Patient reports {sx:14538}.      HISTORY: OB History  Gravida Para Term Preterm AB Living  2 1 1  0 0 1  SAB IAB Ectopic Multiple Live Births  0 0 0 0 1    # Outcome Date GA Lbr Len/2nd Weight Sex Delivery Anes PTL Lv  2 Current           1 Term 05/22/19 [redacted]w[redacted]d 04:58 / 00:56 6 lb 0.3 oz (2.73 kg) M Vag-Spont EPI  LIV     Name: KIMMYA, BRACKENS     Apgar1: 8  Apgar5: 9    Last pap smear was done 01/02/19 and was normal  Past Medical History:  Diagnosis Date   Preeclampsia    Shingles    Past Surgical History:  Procedure Laterality Date   APPENDECTOMY     Family History  Problem Relation Age of Onset   Diabetes Paternal Grandmother    Cancer Paternal Grandmother        lung   Social History   Tobacco Use   Smoking status: Never    Passive exposure: Never   Smokeless tobacco: Never  Vaping Use   Vaping Use: Never used  Substance Use Topics   Alcohol use: Not Currently   Drug use: Never   Allergies  Allergen Reactions   Orange Fruit [Citrus] Hives   Current Outpatient Medications on File Prior to Visit  Medication Sig Dispense Refill   Prenatal Vit-Fe Fumarate-FA (PRENATAL MULTIVITAMIN) TABS tablet Take 1 tablet by mouth daily at 12 noon.     No current facility-administered medications on file prior to visit.    Review of Systems Pertinent items noted in HPI and remainder of comprehensive ROS otherwise negative.  Physical Exam:  There were no vitals filed for this visit.   Bedside Ultrasound for FHR check: Viable intrauterine pregnancy with positive cardiac activity noted, fetal heart rate 156 bpm Patient informed that the ultrasound is considered a limited obstetric ultrasound  and is not intended to be a complete ultrasound exam.  Patient also informed that the ultrasound is not being completed with the intent of assessing for fetal or placental anomalies or any pelvic abnormalities.  Explained that the purpose of todays ultrasound is to assess for fetal heart rate.  Patient acknowledges the purpose of the exam and the limitations of the study. General: well-developed, well-nourished female in no acute distress  Breasts:  normal appearance, no masses or tenderness bilaterally  Skin: normal coloration and turgor, no rashes  Neurologic: oriented, normal, negative, normal mood  Extremities: normal strength, tone, and muscle mass, ROM of all joints is normal  HEENT PERRLA, extraocular movement intact and sclera clear, anicteric  Neck supple and no masses  Cardiovascular: regular rate and rhythm  Respiratory:  no respiratory distress, normal breath sounds  Abdomen: soft, non-tender; bowel sounds normal; no masses,  no organomegaly  Pelvic: normal external genitalia, no lesions, normal vaginal mucosa, normal vaginal discharge, normal cervix, ***pap smear done. Uterine size:      Assessment:    Pregnancy: G2P1001 Patient Active Problem List   Diagnosis Date Noted   Supervision of other normal pregnancy, antepartum 10/24/2021   SVD (spontaneous vaginal delivery) 05/22/2019   Hypertension in pregnancy, preeclampsia, severe,  delivered 05/22/2019   Pre-eclampsia 05/21/2019     Plan:    1. Prenatal care, subsequent pregnancy, first trimester *** - POC Urinalysis Dipstick OB - Babyscripts Schedule Optimization  2. [redacted] weeks gestation of pregnancy *** - POC Urinalysis Dipstick OB - Babyscripts Schedule Optimization   Initial labs drawn. Continue prenatal vitamins. Problem list reviewed and updated. Genetic Screening discussed, {Blank multiple:19196::"First trimester screen","Quad screen","NIPS"}: {requests/ordered/declines:14581}. Ultrasound discussed; fetal  anatomic survey: {requests/ordered/declines:14581}. Anticipatory guidance about prenatal visits given including labs, ultrasounds, and testing. Discussed usage of Babyscripts and virtual visits as additional source of managing and completing prenatal visits in midst of coronavirus and pandemic.   Encouraged to complete MyChart Registration for her ability to review results, send requests, and have questions addressed.  The nature of [place] for [facility] Healthcare/Faculty Practice with multiple MDs and Advanced Practice Providers was explained to patient; also emphasized that residents, students are part of our team. Routine obstetric precautions reviewed. Encouraged to seek out care at office or emergency room [facility] preferred) for urgent and/or emergent concerns. No follow-ups on file.     Felipa Emory, MD, [place], [facility] for [facility] [place], [place]

## 2021-12-11 ENCOUNTER — Encounter (HOSPITAL_BASED_OUTPATIENT_CLINIC_OR_DEPARTMENT_OTHER): Payer: Self-pay | Admitting: Obstetrics & Gynecology

## 2021-12-11 LAB — URINE CULTURE

## 2021-12-11 NOTE — Progress Notes (Signed)
History:   Erika Christian is a 26 y.o. G2P1001 at [redacted]w[redacted]d by LMP being seen today for her first obstetrical visit.  Her obstetrical history is significant for pre-eclampsia. Patient does intend to breast feed. Pregnancy history fully reviewed.  Patient reports no complaints.  Denies vaginal bleeding or discharge.  Denies pain.      HISTORY: OB History  Gravida Para Term Preterm AB Living  2 1 1  0 0 1  SAB IAB Ectopic Multiple Live Births  0 0 0 0 1    # Outcome Date GA Lbr Len/2nd Weight Sex Delivery Anes PTL Lv  2 Current           1 Term 05/22/19 [redacted]w[redacted]d 04:58 / 00:56 6 lb 0.3 oz (2.73 kg) M Vag-Spont EPI  LIV     Name: Knutzen,BOY [place]e     Apgar1: 8  Apgar5: 9    Last pap smear was obtained today.  Past Medical History:  Diagnosis Date   Preeclampsia    Shingles    Past Surgical History:  Procedure Laterality Date   APPENDECTOMY     Family History  Problem Relation Age of Onset   Diabetes Paternal Grandmother    Cancer Paternal Grandmother        lung   Social History   Tobacco Use   Smoking status: Never    Passive exposure: Never   Smokeless tobacco: Never  Vaping Use   Vaping Use: Never used  Substance Use Topics   Alcohol use: Not Currently   Drug use: Never   Allergies  Allergen Reactions   Orange Fruit [Citrus] Hives   Current Outpatient Medications on File Prior to Visit  Medication Sig Dispense Refill   Prenatal Vit-Fe Fumarate-FA (PRENATAL MULTIVITAMIN) TABS tablet Take 1 tablet by mouth daily at 12 noon.     No current facility-administered medications on file prior to visit.    Review of Systems Pertinent items noted in HPI and remainder of comprehensive ROS otherwise negative.  Physical Exam:   Vitals:   12/08/21 0919  BP: (!) 127/54  Pulse: 95  Weight: 198 lb 9.6 oz (90.1 kg)   Fetal Heart Rate (bpm): 156 Bedside Ultrasound for FHR check: Viable intrauterine pregnancy with positive cardiac activity noted, fetal heart rate  156bpm Patient informed that the ultrasound is considered a limited obstetric ultrasound and is not intended to be a complete ultrasound exam.  Patient also informed that the ultrasound is not being completed with the intent of assessing for fetal or placental anomalies or any pelvic abnormalities.  Explained that the purpose of todays ultrasound is to assess for fetal heart rate.  Patient acknowledges the purpose of the exam and the limitations of the study. General: well-developed, well-nourished female in no acute distress  Breasts:  normal appearance, no masses or tenderness bilaterally  Skin: normal coloration and turgor, no rashes  Neurologic: oriented, normal, negative, normal mood  Extremities: normal strength, tone, and muscle mass, ROM of all joints is normal  HEENT PERRLA, extraocular movement intact and sclera clear, anicteric  Neck supple and no masses  Cardiovascular: regular rate and rhythm  Respiratory:  no respiratory distress, normal breath sounds  Abdomen: soft, non-tender; bowel sounds normal; no masses,  no organomegaly  Pelvic: normal external genitalia, no lesions, normal vaginal mucosa, normal vaginal discharge, normal cervix, pap smear done. Uterine size:  12 weeks    Assessment:    Pregnancy: G2P1001 Patient Active Problem List   Diagnosis Date Noted  Supervision of high risk pregnancy, antepartum 10/24/2021   SVD (spontaneous vaginal delivery) 05/22/2019   Hypertension in pregnancy, preeclampsia, severe, delivered 05/22/2019   Pre-eclampsia 05/21/2019     Plan:    1. [redacted] weeks gestation of pregnancy - On PNV - Urine Culture - ABO/Rh - Antibody screen - CBC - Hepatitis B surface antigen - HIV Antibody (routine testing w rflx) - HIV (Save tube for possible reflex) - RPR - Rubella screen - Hepatitis C antibody - Cytology - PAP - Hemoglobin A1c - aspirin 81 MG EC tablet; Take 1 tablet (81 mg total) by mouth daily. Swallow whole.  Dispense: 30 tablet;  Refill: 12 - Genetic Screening  2. History of pre-eclampsia - will obtain baseline labs/urine  3. BMI 40.0-44.9, adult (HCC) - hba1c obtained today  4. Supervision of high risk pregnancy, antepartum   Initial labs drawn. Continue prenatal vitamins. Problem list reviewed and updated. Genetic Screening discussed, NIPS: ordered. Ultrasound discussed; fetal anatomic survey: ordered. Anticipatory guidance about prenatal visits given including labs, ultrasounds, and testing. Discussed usage of Babyscripts and virtual visits as additional source of managing and completing prenatal visits in midst of coronavirus and pandemic.   Encouraged to complete MyChart Registration for her ability to review results, send requests, and have questions addressed.  The nature of [place] for [facility]ulty Practice with multiple MDs and Advanced Practice Providers was explained to patient; also emphasized that residents, students are part of our team. Routine obstetric precautions reviewed. Encouraged to seek out care at office or emergency room [facility] preferred) for urgent and/or emergent concerns. Return in about 4 weeks (around 01/05/2022).     Felipa Emory, MD, [place], [facility] for [facility], [place]

## 2021-12-12 ENCOUNTER — Other Ambulatory Visit (HOSPITAL_BASED_OUTPATIENT_CLINIC_OR_DEPARTMENT_OTHER): Payer: Self-pay | Admitting: *Deleted

## 2021-12-12 LAB — HEPATITIS C ANTIBODY: Hep C Virus Ab: <0.1 s/co ratio (ref 0.0–0.9)

## 2021-12-12 LAB — CBC
Hematocrit: 37.9 % (ref 34.0–46.6)
Hemoglobin: 12.4 g/dL (ref 11.1–15.9)
MCH: 28.6 pg (ref 26.6–33.0)
MCHC: 32.7 g/dL (ref 31.5–35.7)
MCV: 88 fL (ref 79–97)
Platelets: 290 10*3/uL (ref 150–450)
RBC: 4.33 x10E6/uL (ref 3.77–5.28)
RDW: 13.6 % (ref 11.7–15.4)
WBC: 11.2 10*3/uL — ABNORMAL HIGH (ref 3.4–10.8)

## 2021-12-12 LAB — CYTOLOGY - PAP
Chlamydia: NEGATIVE
Comment: NEGATIVE
Comment: NORMAL
Diagnosis: NEGATIVE
Neisseria Gonorrhea: NEGATIVE

## 2021-12-12 LAB — ANTIBODY SCREEN: Antibody Screen: NEGATIVE

## 2021-12-12 LAB — ABO/RH: Rh Factor: POSITIVE

## 2021-12-12 LAB — HIV ANTIBODY (ROUTINE TESTING W REFLEX): HIV Screen 4th Generation wRfx: NONREACTIVE

## 2021-12-12 LAB — HEMOGLOBIN A1C
Est. average glucose Bld gHb Est-mCnc: 108 mg/dL
Hgb A1c MFr Bld: 5.4 % (ref 4.8–5.6)

## 2021-12-12 LAB — RPR

## 2021-12-12 LAB — HEPATITIS B SURFACE ANTIGEN: Hepatitis B Surface Ag: NEGATIVE

## 2021-12-12 LAB — RUBELLA SCREEN: Rubella Antibodies, IGG: <0.9 index — ABNORMAL LOW (ref >0.99)

## 2021-12-12 MED ORDER — AMOXICILLIN-POT CLAVULANATE 500-125 MG PO TABS: 1.0000 | ORAL_TABLET | Freq: Three times a day (TID) | ORAL | 0 refills | Status: DC

## 2021-12-12 NOTE — Progress Notes (Signed)
Pt here for new OB intake. Denies vaginal bleeding. OB booklet given and discussed. Order placed for u/s to determine fetal viability

## 2021-12-12 NOTE — Progress Notes (Signed)
Rx sent to pharmacy for treatment of UTI 

## 2021-12-15 ENCOUNTER — Encounter (HOSPITAL_BASED_OUTPATIENT_CLINIC_OR_DEPARTMENT_OTHER): Payer: Self-pay | Admitting: Obstetrics & Gynecology

## 2021-12-22 ENCOUNTER — Other Ambulatory Visit (HOSPITAL_BASED_OUTPATIENT_CLINIC_OR_DEPARTMENT_OTHER): Payer: Medicaid Other

## 2021-12-26 ENCOUNTER — Other Ambulatory Visit (HOSPITAL_BASED_OUTPATIENT_CLINIC_OR_DEPARTMENT_OTHER): Payer: Medicaid Other

## 2021-12-26 ENCOUNTER — Other Ambulatory Visit: Payer: Self-pay

## 2021-12-27 LAB — RPR: RPR Ser Ql: NONREACTIVE

## 2022-01-05 ENCOUNTER — Encounter (HOSPITAL_BASED_OUTPATIENT_CLINIC_OR_DEPARTMENT_OTHER): Payer: Self-pay | Admitting: Obstetrics & Gynecology

## 2022-01-05 ENCOUNTER — Other Ambulatory Visit: Payer: Self-pay

## 2022-01-05 ENCOUNTER — Ambulatory Visit (INDEPENDENT_AMBULATORY_CARE_PROVIDER_SITE_OTHER): Payer: Medicaid Other | Admitting: Obstetrics & Gynecology

## 2022-01-05 VITALS — BP 125/82 | HR 102 | Wt 199.0 lb

## 2022-01-05 DIAGNOSIS — O09892 Supervision of other high risk pregnancies, second trimester: Secondary | ICD-10-CM

## 2022-01-05 DIAGNOSIS — Z3481 Encounter for supervision of other normal pregnancy, first trimester: Secondary | ICD-10-CM | POA: Diagnosis not present

## 2022-01-05 DIAGNOSIS — Z3A15 15 weeks gestation of pregnancy: Secondary | ICD-10-CM

## 2022-01-05 DIAGNOSIS — O0992 Supervision of high risk pregnancy, unspecified, second trimester: Secondary | ICD-10-CM

## 2022-01-05 DIAGNOSIS — Z2839 Other underimmunization status: Secondary | ICD-10-CM

## 2022-01-05 DIAGNOSIS — Z6841 Body Mass Index (BMI) 40.0 and over, adult: Secondary | ICD-10-CM

## 2022-01-05 DIAGNOSIS — Z8759 Personal history of other complications of pregnancy, childbirth and the puerperium: Secondary | ICD-10-CM

## 2022-01-05 DIAGNOSIS — O0990 Supervision of high risk pregnancy, unspecified, unspecified trimester: Secondary | ICD-10-CM

## 2022-01-05 DIAGNOSIS — N3000 Acute cystitis without hematuria: Secondary | ICD-10-CM

## 2022-01-05 NOTE — Progress Notes (Signed)
? ?  PRENATAL VISIT NOTE ? ?Subjective:  ?Erika Christian is a 26 y.o. G2P1001 at [redacted]w[redacted]d being seen today for ongoing prenatal care.  She is currently monitored for the following issues for this high-risk pregnancy and has Pre-eclampsia; SVD (spontaneous vaginal delivery); Hypertension in pregnancy, preeclampsia, severe, delivered; Supervision of high risk pregnancy, antepartum; BMI 40.0-44.9, adult ([place]); and History of pre-eclampsia on their problem list. ? ?Patient reports no complaints.  Contractions: Not present. Vag. Bleeding: None.  Movement: Absent. Denies leaking of fluid.  ? ?The following portions of the patient's history were reviewed and updated as appropriate: allergies, current medications, past family history, past medical history, past social history, past surgical history and problem list.  ? ?Objective:  ? ?Vitals:  ? 01/05/22 1042  ?BP: 125/82  ?Pulse: (!) 102  ?Weight: 199 lb (90.3 kg)  ? ? ?Fetal Status: Fetal Heart Rate (bpm): 146   Movement: Absent    ? ?General:  Alert, oriented and cooperative. Patient is in no acute distress.  ?Skin: Skin is warm and dry. No rash noted.   ?Cardiovascular: Normal heart rate noted  ?Respiratory: Normal respiratory effort, no problems with respiration noted  ?Abdomen: Soft, gravid, appropriate for gestational age.  Pain/Pressure: Absent     ?Pelvic: Cervical exam deferred        ?Extremities: Normal range of motion.  Edema: None  ?Mental Status: Normal mood and affect. Normal behavior. Normal judgment and thought content.  ? ?Assessment and Plan:  ?Pregnancy: G2P1001 at [redacted]w[redacted]d ?1. [redacted] weeks gestation of pregnancy ?- on PNV and baby ASA ?- AFP, Serum, Open Spina Bifida ?- [place] MFM OB DETAIL +14 WK; Future ? ?2. History of pre-eclampsia ?- Comprehensive metabolic panel ?- Protein / creatinine ratio, urine ? ?3. Supervision of high risk pregnancy, antepartum ?- [place] MFM OB DETAIL +14 WK; Future ? ?4. H/o UTI, symptoms improved ?- Urine Culture ? ?5. BMI 40.0-44.9,  adult ([place]) ?- early pregnancy HbA1C was 5.4 ?  ?Preterm labor symptoms and general obstetric precautions including but not limited to vaginal bleeding, contractions, leaking of fluid and fetal movement were reviewed in detail with the patient. ?Please refer to After Visit Summary for other counseling recommendations.  ? ?Return in about 4 weeks (around 02/02/2022). ? ?Future Appointments  ?Date Time Provider [place]  ?01/30/2022  8:30 AM [facility] NURSE [facility]  ?01/30/2022  8:45 AM [facility] US5 [facility]  ?01/30/2022  2:45 PM Megan Salon, MD [facility]  ?03/02/2022 10:15 AM Megan Salon, MD [facility]  ?03/30/2022  8:15 AM Megan Salon, MD [facility]  ?04/17/2022  9:15 AM Megan Salon, MD [facility]  ?05/01/2022  8:45 AM Megan Salon, MD [facility]  ?05/15/2022 10:45 AM Megan Salon, MD [facility]  ?05/29/2022  8:45 AM Megan Salon, MD [facility]  ?06/05/2022 10:15 AM Megan Salon, MD [facility]  ?06/12/2022  8:45 AM Megan Salon, MD [facility]  ?06/26/2022 10:45 AM Megan Salon, MD [facility]  ? ? ?Megan Salon, MD  ?

## 2022-01-06 DIAGNOSIS — Z2839 Other underimmunization status: Secondary | ICD-10-CM | POA: Insufficient documentation

## 2022-01-06 DIAGNOSIS — O09899 Supervision of other high risk pregnancies, unspecified trimester: Secondary | ICD-10-CM | POA: Insufficient documentation

## 2022-01-06 DIAGNOSIS — Z8759 Personal history of other complications of pregnancy, childbirth and the puerperium: Secondary | ICD-10-CM | POA: Insufficient documentation

## 2022-01-06 DIAGNOSIS — Z6841 Body Mass Index (BMI) 40.0 and over, adult: Secondary | ICD-10-CM | POA: Insufficient documentation

## 2022-01-06 HISTORY — DX: Body Mass Index (BMI) 40.0 and over, adult: Z684

## 2022-01-06 LAB — COMPREHENSIVE METABOLIC PANEL
ALT: 8 IU/L (ref 0–32)
AST: 16 IU/L (ref 0–40)
Albumin/Globulin Ratio: 1.8 (ref 1.2–2.2)
Albumin: 4.6 g/dL (ref 3.9–5.0)
Alkaline Phosphatase: 75 IU/L (ref 44–121)
BUN/Creatinine Ratio: 15 (ref 9–23)
BUN: 8 mg/dL (ref 6–20)
Bilirubin Total: 0.2 mg/dL (ref 0.0–1.2)
CO2: 17 mmol/L — ABNORMAL LOW (ref 20–29)
Calcium: 10 mg/dL (ref 8.7–10.2)
Chloride: 102 mmol/L (ref 96–106)
Creatinine, Ser: 0.52 mg/dL — ABNORMAL LOW (ref 0.57–1.00)
Globulin, Total: 2.6 g/dL (ref 1.5–4.5)
Glucose: 75 mg/dL (ref 70–99)
Potassium: 4.3 mmol/L (ref 3.5–5.2)
Sodium: 137 mmol/L (ref 134–144)
Total Protein: 7.2 g/dL (ref 6.0–8.5)
eGFR: 131 mL/min/{1.73_m2} (ref >59)

## 2022-01-06 LAB — PROTEIN / CREATININE RATIO, URINE
Creatinine, Urine: 129.3 mg/dL
Protein, Ur: 21.4 mg/dL
Protein/Creat Ratio: 166 mg/g creat (ref 0–200)

## 2022-01-07 LAB — AFP, SERUM, OPEN SPINA BIFIDA
AFP MoM: 1.03
AFP Value: 30.3 ng/mL
Gest. Age on Collection Date: 15.3 weeks
Maternal Age At EDD: 26.5 yr
OSBR Risk 1 IN: 10000
Test Results:: NEGATIVE
Weight: 199 [lb_av]

## 2022-01-09 ENCOUNTER — Other Ambulatory Visit (HOSPITAL_BASED_OUTPATIENT_CLINIC_OR_DEPARTMENT_OTHER): Payer: Self-pay | Admitting: Obstetrics & Gynecology

## 2022-01-09 LAB — URINE CULTURE

## 2022-01-09 MED ORDER — CEPHALEXIN 500 MG PO CAPS: 500.0000 mg | ORAL_CAPSULE | Freq: Four times a day (QID) | ORAL | 0 refills | Status: DC

## 2022-01-12 ENCOUNTER — Encounter: Payer: Self-pay | Admitting: Obstetrics and Gynecology

## 2022-01-13 ENCOUNTER — Encounter (HOSPITAL_BASED_OUTPATIENT_CLINIC_OR_DEPARTMENT_OTHER): Payer: Self-pay | Admitting: Obstetrics & Gynecology

## 2022-01-13 NOTE — Telephone Encounter (Signed)
Called pt in response to [employer]. Pt is asking if she needs to come into the office today. Advised pt, per Dr. Lianne Bushy recommendation, to continue to monitor her BP and enter into the app as well as keep a log. Advised that she should check her BP more than once a week now. Advised to repeat BP if reading abnormal. Pt verbalized understanding.  ?

## 2022-01-18 ENCOUNTER — Encounter (HOSPITAL_BASED_OUTPATIENT_CLINIC_OR_DEPARTMENT_OTHER): Payer: Self-pay | Admitting: *Deleted

## 2022-01-30 ENCOUNTER — Other Ambulatory Visit: Payer: Self-pay | Admitting: *Deleted

## 2022-01-30 ENCOUNTER — Ambulatory Visit: Payer: Medicaid Other | Admitting: *Deleted

## 2022-01-30 ENCOUNTER — Other Ambulatory Visit: Payer: Self-pay

## 2022-01-30 ENCOUNTER — Ambulatory Visit (INDEPENDENT_AMBULATORY_CARE_PROVIDER_SITE_OTHER): Payer: Medicaid Other | Admitting: Obstetrics & Gynecology

## 2022-01-30 ENCOUNTER — Encounter: Payer: Self-pay | Admitting: *Deleted

## 2022-01-30 ENCOUNTER — Ambulatory Visit: Payer: Medicaid Other | Attending: Obstetrics & Gynecology

## 2022-01-30 ENCOUNTER — Encounter (HOSPITAL_BASED_OUTPATIENT_CLINIC_OR_DEPARTMENT_OTHER): Payer: Self-pay | Admitting: Obstetrics & Gynecology

## 2022-01-30 VITALS — BP 133/67 | HR 101

## 2022-01-30 VITALS — BP 136/79 | HR 97 | Wt 205.6 lb

## 2022-01-30 DIAGNOSIS — O1492 Unspecified pre-eclampsia, second trimester: Secondary | ICD-10-CM | POA: Insufficient documentation

## 2022-01-30 DIAGNOSIS — Z6836 Body mass index (BMI) 36.0-36.9, adult: Secondary | ICD-10-CM

## 2022-01-30 DIAGNOSIS — Z363 Encounter for antenatal screening for malformations: Secondary | ICD-10-CM | POA: Diagnosis not present

## 2022-01-30 DIAGNOSIS — Z3A15 15 weeks gestation of pregnancy: Secondary | ICD-10-CM | POA: Diagnosis not present

## 2022-01-30 DIAGNOSIS — O0990 Supervision of high risk pregnancy, unspecified, unspecified trimester: Secondary | ICD-10-CM

## 2022-01-30 DIAGNOSIS — Z3A19 19 weeks gestation of pregnancy: Secondary | ICD-10-CM | POA: Insufficient documentation

## 2022-01-30 DIAGNOSIS — N3000 Acute cystitis without hematuria: Secondary | ICD-10-CM

## 2022-01-30 DIAGNOSIS — O0992 Supervision of high risk pregnancy, unspecified, second trimester: Secondary | ICD-10-CM

## 2022-01-30 DIAGNOSIS — O09299 Supervision of pregnancy with other poor reproductive or obstetric history, unspecified trimester: Secondary | ICD-10-CM

## 2022-01-30 DIAGNOSIS — O99212 Obesity complicating pregnancy, second trimester: Secondary | ICD-10-CM | POA: Insufficient documentation

## 2022-01-30 DIAGNOSIS — Z8759 Personal history of other complications of pregnancy, childbirth and the puerperium: Secondary | ICD-10-CM

## 2022-01-30 DIAGNOSIS — O09292 Supervision of pregnancy with other poor reproductive or obstetric history, second trimester: Secondary | ICD-10-CM | POA: Diagnosis not present

## 2022-01-30 DIAGNOSIS — Z6841 Body Mass Index (BMI) 40.0 and over, adult: Secondary | ICD-10-CM

## 2022-01-30 DIAGNOSIS — N390 Urinary tract infection, site not specified: Secondary | ICD-10-CM

## 2022-01-30 NOTE — Progress Notes (Signed)
urine ? ?PRENATAL VISIT NOTE ? ?Subjective:  ?Erika Christian is a 26 y.o. G2P1001 at [redacted]w[redacted]d being seen today for ongoing prenatal care.  She is currently monitored for the following issues for this high-risk pregnancy and has Supervision of high risk pregnancy, antepartum; BMI 40.0-44.9, adult ([place]); History of pre-eclampsia; and Rubella non-immune status, antepartum on their problem list. ? ?Patient reports no complaints.  Contractions: Not present. Vag. Bleeding: None.  Movement: Present. Denies leaking of fluid.  ? ?The following portions of the patient's history were reviewed and updated as appropriate: allergies, current medications, past family history, past medical history, past social history, past surgical history and problem list.  ? ?Objective:  ? ?Vitals:  ? 01/30/22 1501  ?BP: 136/79  ?Pulse: 97  ?Weight: 205 lb 9.6 oz (93.3 kg)  ? ? ?Fetal Status: Fetal Heart Rate (bpm): 140   Movement: Present    ? ?General:  Alert, oriented and cooperative. Patient is in no acute distress.  ?Skin: Skin is warm and dry. No rash noted.   ?Cardiovascular: Normal heart rate noted  ?Respiratory: Normal respiratory effort, no problems with respiration noted  ?Abdomen: Soft, gravid, appropriate for gestational age.  Pain/Pressure: Absent     ?Pelvic: Cervical exam deferred        ?Extremities: Normal range of motion.  Edema: None  ?Mental Status: Normal mood and affect. Normal behavior. Normal judgment and thought content.  ? ?Assessment and Plan:  ?Pregnancy: G2P1001 at [redacted]w[redacted]d ?1. [redacted] weeks gestation of pregnancy ?- on PNV and baby ASA ?- has follow up anatomy u/s scheduled to complete anatomy ? ?2. Supervision of high risk pregnancy, antepartum ? ?3. History of pre-eclampsia ?- baseline protein/creatinine ratio has been completed, CMP and CBC ? ?4. Acute cystitis without hematuria ?- has been treated x 2 with pregnancy.  Will repeat urine culture tody. ?- Urine Culture ? ?5. BMI 40.0-44.9, adult ([place])  ? ?Preterm labor  symptoms and general obstetric precautions including but not limited to vaginal bleeding, contractions, leaking of fluid and fetal movement were reviewed in detail with the patient. ?Please refer to After Visit Summary for other counseling recommendations.  ? ?Return in about 4 weeks (around 02/27/2022). ? ?Future Appointments  ?Date Time Provider [place]  ?02/27/2022 10:45 AM [facility] NURSE [facility]  ?02/27/2022 11:00 AM [facility] US1 [facility]  ?03/02/2022 10:15 AM Megan Salon, MD [facility]  ?03/30/2022  8:15 AM Megan Salon, MD [facility]  ?04/17/2022  9:30 AM Megan Salon, MD [facility]  ?05/01/2022  8:45 AM Megan Salon, MD [facility]  ?05/15/2022 10:45 AM Megan Salon, MD [facility]  ?05/29/2022  8:45 AM Megan Salon, MD [facility]  ?06/05/2022 10:15 AM Megan Salon, MD [facility]  ?06/12/2022  8:45 AM Megan Salon, MD [facility]  ?06/26/2022 10:45 AM Megan Salon, MD [facility]  ? ? ?Megan Salon, MD  ?

## 2022-02-02 LAB — URINE CULTURE

## 2022-02-08 ENCOUNTER — Encounter (HOSPITAL_BASED_OUTPATIENT_CLINIC_OR_DEPARTMENT_OTHER): Payer: Self-pay | Admitting: Obstetrics & Gynecology

## 2022-02-08 ENCOUNTER — Other Ambulatory Visit (HOSPITAL_BASED_OUTPATIENT_CLINIC_OR_DEPARTMENT_OTHER): Payer: Self-pay | Admitting: *Deleted

## 2022-02-08 DIAGNOSIS — N390 Urinary tract infection, site not specified: Secondary | ICD-10-CM

## 2022-02-08 MED ORDER — SULFAMETHOXAZOLE-TRIMETHOPRIM 800-160 MG PO TABS: 1.0000 | ORAL_TABLET | Freq: Two times a day (BID) | ORAL | 0 refills | Status: AC

## 2022-02-08 NOTE — Progress Notes (Signed)
Rx of bactrim sent for recurrent uti ?

## 2022-02-08 NOTE — Addendum Note (Signed)
Addended by: Jerene Bears on: 02/08/2022 12:53 AM ? ? Modules accepted: Orders ? ?

## 2022-02-26 ENCOUNTER — Encounter (HOSPITAL_BASED_OUTPATIENT_CLINIC_OR_DEPARTMENT_OTHER): Payer: Self-pay | Admitting: Obstetrics & Gynecology

## 2022-02-27 ENCOUNTER — Ambulatory Visit: Payer: Medicaid Other | Admitting: *Deleted

## 2022-02-27 ENCOUNTER — Encounter: Payer: Self-pay | Admitting: *Deleted

## 2022-02-27 ENCOUNTER — Encounter (HOSPITAL_BASED_OUTPATIENT_CLINIC_OR_DEPARTMENT_OTHER): Payer: Self-pay | Admitting: Obstetrics & Gynecology

## 2022-02-27 ENCOUNTER — Ambulatory Visit (INDEPENDENT_AMBULATORY_CARE_PROVIDER_SITE_OTHER): Payer: Medicaid Other | Admitting: Obstetrics & Gynecology

## 2022-02-27 ENCOUNTER — Ambulatory Visit: Payer: Medicaid Other | Attending: Obstetrics and Gynecology

## 2022-02-27 ENCOUNTER — Other Ambulatory Visit: Payer: Self-pay | Admitting: *Deleted

## 2022-02-27 VITALS — BP 131/62 | HR 101

## 2022-02-27 VITALS — BP 129/82 | HR 98 | Wt 203.2 lb

## 2022-02-27 DIAGNOSIS — E669 Obesity, unspecified: Secondary | ICD-10-CM | POA: Diagnosis not present

## 2022-02-27 DIAGNOSIS — Z6841 Body Mass Index (BMI) 40.0 and over, adult: Secondary | ICD-10-CM

## 2022-02-27 DIAGNOSIS — Z363 Encounter for antenatal screening for malformations: Secondary | ICD-10-CM | POA: Insufficient documentation

## 2022-02-27 DIAGNOSIS — O99212 Obesity complicating pregnancy, second trimester: Secondary | ICD-10-CM | POA: Insufficient documentation

## 2022-02-27 DIAGNOSIS — O0992 Supervision of high risk pregnancy, unspecified, second trimester: Secondary | ICD-10-CM

## 2022-02-27 DIAGNOSIS — O0990 Supervision of high risk pregnancy, unspecified, unspecified trimester: Secondary | ICD-10-CM

## 2022-02-27 DIAGNOSIS — N390 Urinary tract infection, site not specified: Secondary | ICD-10-CM | POA: Diagnosis not present

## 2022-02-27 DIAGNOSIS — O09299 Supervision of pregnancy with other poor reproductive or obstetric history, unspecified trimester: Secondary | ICD-10-CM

## 2022-02-27 DIAGNOSIS — Z2839 Other underimmunization status: Secondary | ICD-10-CM

## 2022-02-27 DIAGNOSIS — Z3A23 23 weeks gestation of pregnancy: Secondary | ICD-10-CM

## 2022-02-27 DIAGNOSIS — Z6836 Body mass index (BMI) 36.0-36.9, adult: Secondary | ICD-10-CM | POA: Insufficient documentation

## 2022-02-27 DIAGNOSIS — Z8759 Personal history of other complications of pregnancy, childbirth and the puerperium: Secondary | ICD-10-CM

## 2022-02-27 DIAGNOSIS — O09892 Supervision of other high risk pregnancies, second trimester: Secondary | ICD-10-CM

## 2022-02-27 DIAGNOSIS — O09899 Supervision of other high risk pregnancies, unspecified trimester: Secondary | ICD-10-CM

## 2022-02-27 DIAGNOSIS — Z3A11 11 weeks gestation of pregnancy: Secondary | ICD-10-CM

## 2022-02-27 HISTORY — DX: Urinary tract infection, site not specified: N39.0

## 2022-02-27 IMAGING — US US MFM OB FOLLOW-UP
1 series · 13 of 28 positions shown · non-contrast
Comparison: none

[Series 1: us mfm ob follow-up · 94 acquisitions, 13 frames shown]
[im 4/94]
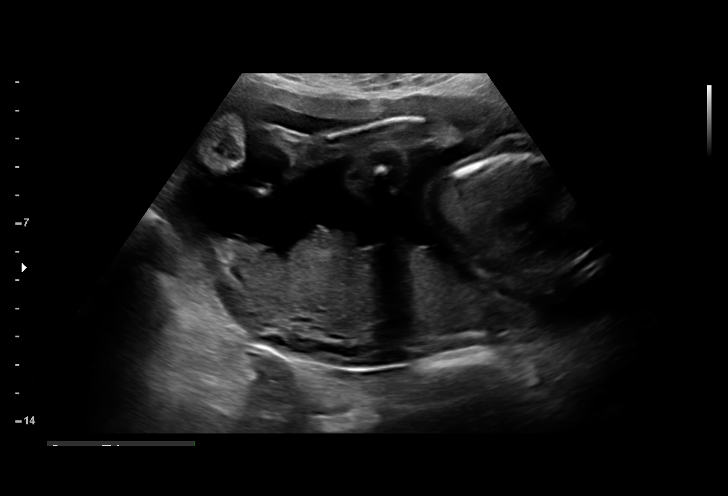
[im 11/94]
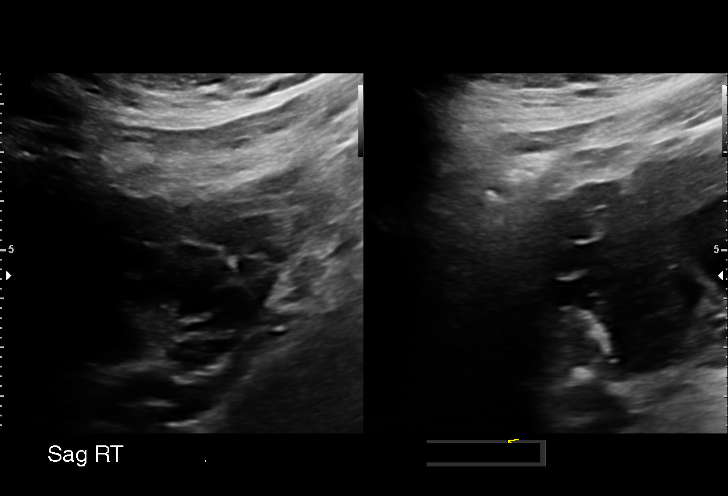
[im 18/94]
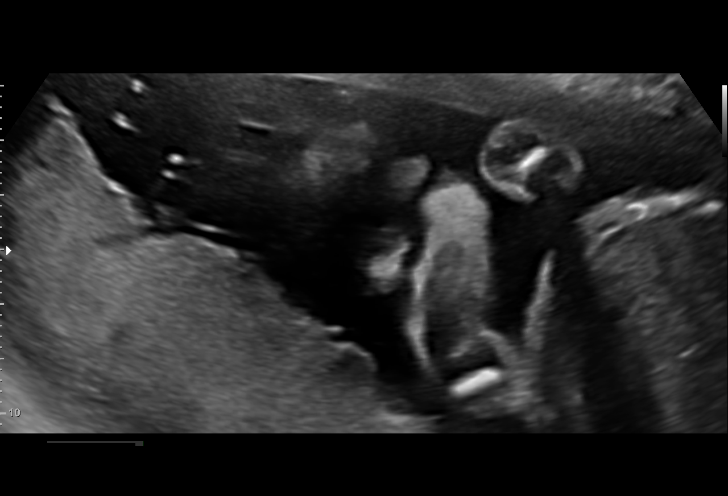
[im 25/94]
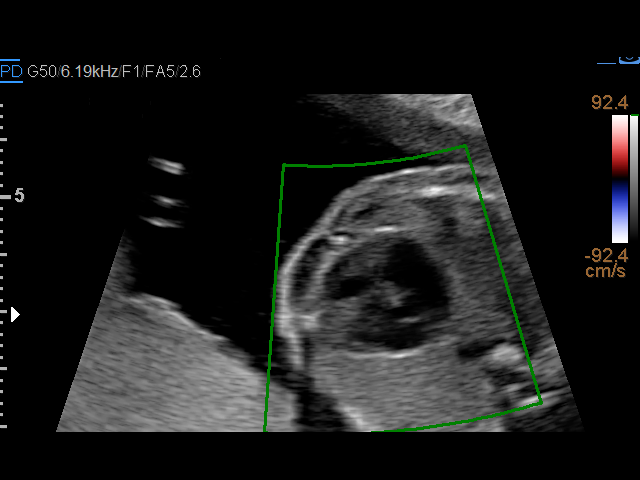
[im 32/94]
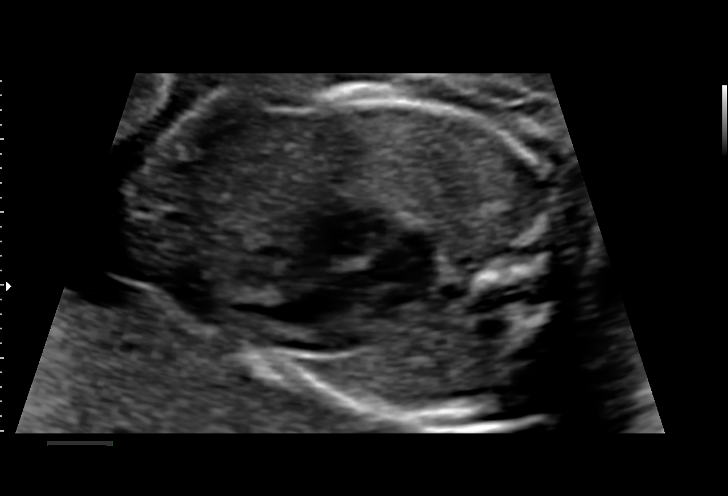
[im 38/94]
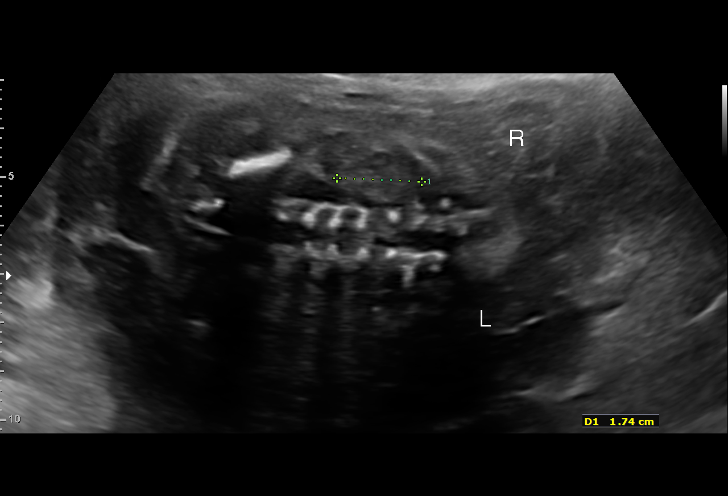
[im 49/94]
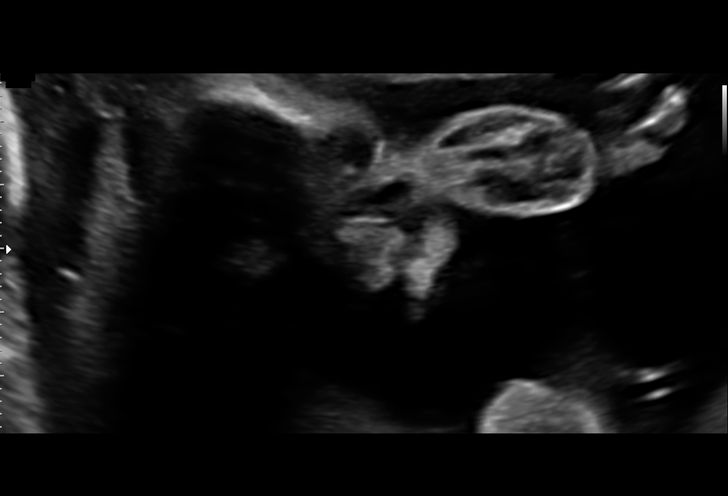
[im 56/94]
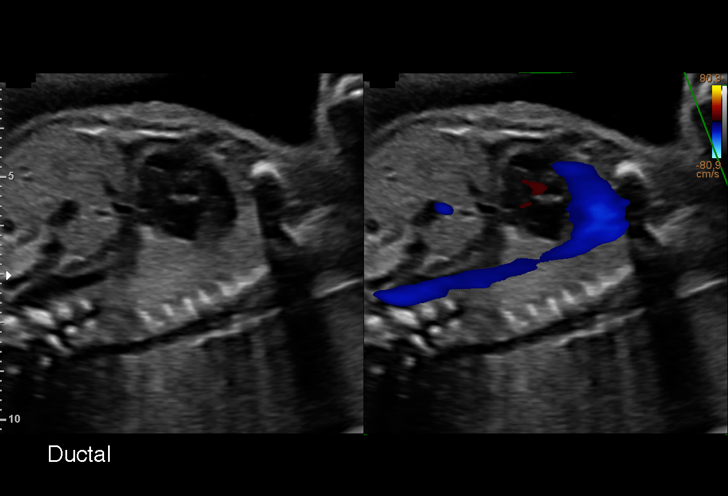
[im 63/94]
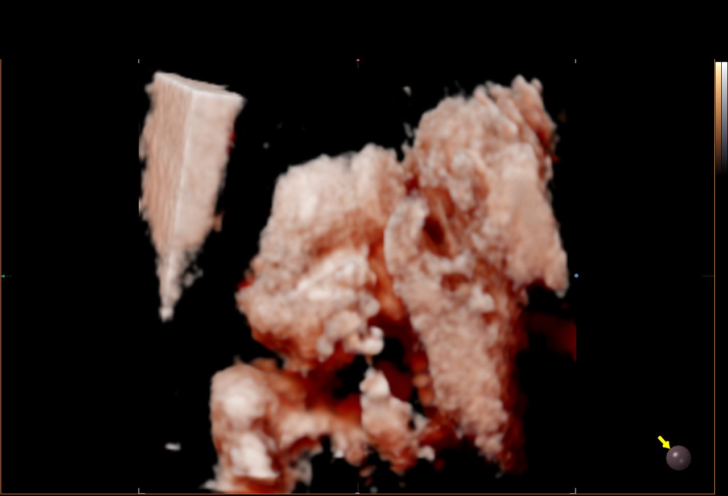
[im 69/94]
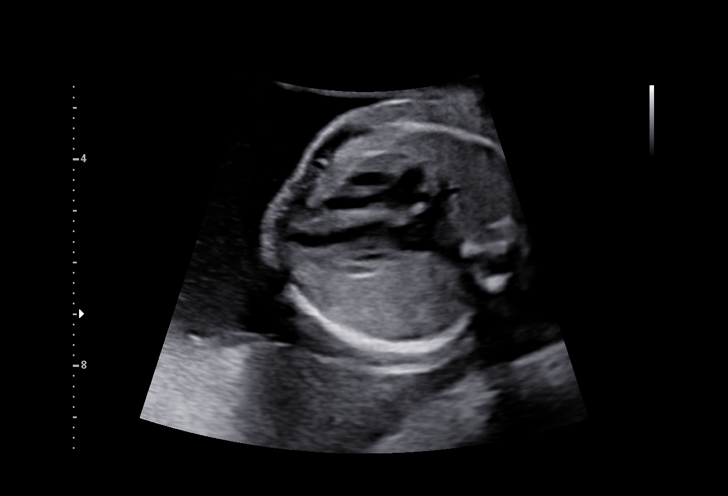
[im 76/94]
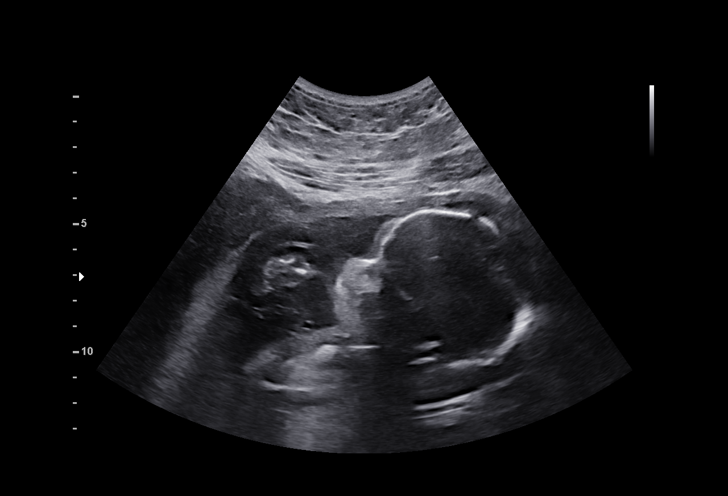
[im 83/94]
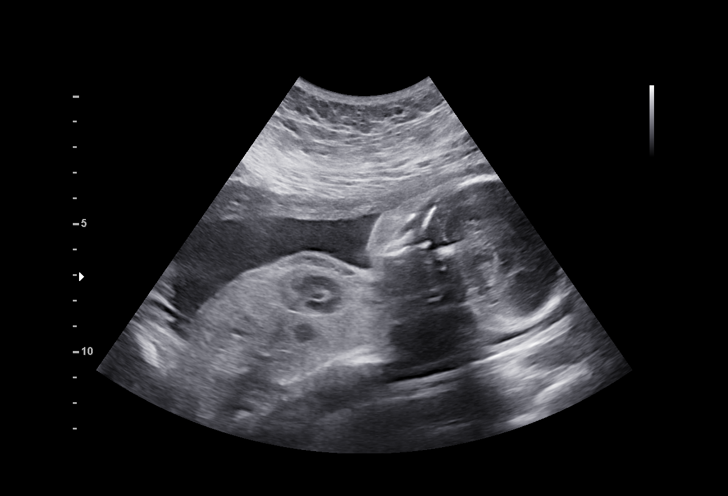
[im 90/94]
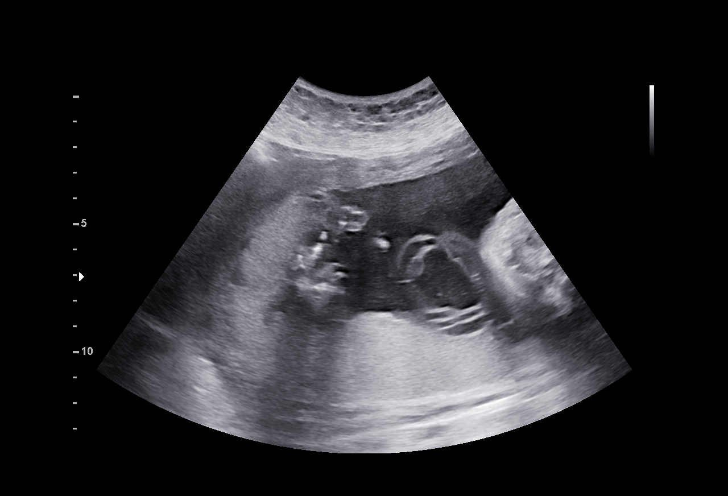

[13 of 28 positions shown; findings below may reference images not displayed]

[address]kw[address]
                   [HOSPITAL] -
                   [place]

Indications

 Obesity complicating pregnancy, second         [G1]
 trimester (pregestational BMI 44)
 Poor obstetric history: Previous               [G1]
 preeclampsia / eclampsia/gestational HTN
 Encounter for antenatal screening for          [G1]
 malformations
 LR NIPS/Negative AFP
 23 weeks gestation of pregnancy
Fetal Evaluation

 Num Of Fetuses:         1
 Fetal Heart Rate(bpm):  143
 Cardiac Activity:       Observed
 Presentation:           Transverse, head to maternal right
 Placenta:               Posterior
 P. Cord Insertion:      Visualized, central

 Amniotic Fluid
 AFI FV:      Within normal limits

                             Largest Pocket(cm)

Biometry
 BPD:        57  mm     G. Age:  23w 3d         62  %    CI:        74.63   %    70 - 86
                                                         FL/HC:      18.4   %    19.2 -
 HC:      209.4  mm     G. Age:  23w 0d         36  %    HC/AC:      1.16        1.05 -
 AC:      180.1  mm     G. Age:  22w 6d         37  %    FL/BPD:     67.5   %    71 - 87
 FL:       38.5  mm     G. Age:  22w 2d         19  %    FL/AC:      21.4   %    20 - 24
 HUM:      35.2  mm     G. Age:  22w 1d         25  %

 LV:        3.9  mm

 Est. FW:     527  gm      1 lb 3 oz     29  %
OB History

 Blood Type:   O+
 Gravidity:    2         Term:   1
 Living:       1
Gestational Age

 LMP:           23w 0d        Date:  [DATE]                 EDD:   [DATE]
 U/S Today:     22w 6d                                        EDD:   [DATE]
 Best:          23w 0d     Det. By:  LMP  ([DATE])          EDD:   [DATE]
Anatomy

 Cranium:               Appears normal         LVOT:                   Appears normal
 Cavum:                 Appears normal         Aortic Arch:            Appears normal
 Ventricles:            Appears normal         Ductal Arch:            Appears normal
 Choroid Plexus:        Previously seen        Diaphragm:              Appears normal
 Cerebellum:            Previously seen        Stomach:                Appears normal, left
                                                                       sided
 Posterior Fossa:       Previously seen        Abdomen:                Appears normal
 Nuchal Fold:           Previously seen        Abdominal Wall:         Previously seen
 Face:                  Orbits and profile     Cord Vessels:           Previously seen
                        previously seen
 Lips:                  Appears normal         Kidneys:                Appear normal
 Palate:                Not well visualized    Bladder:                Appears normal
 Thoracic:              Appears normal         Spine:                  Previously seen
                        Previously seen                                limited
 Heart:                 Appears normal         Upper Extremities:      Previously seen
                        (4CH, axis, and
                        situs)
 RVOT:                  Appears normal         Lower Extremities:      Previously seen

 Other:  Fetus appears to be female. 3vv visualized. Nasal bone, lenses,
         maxilla, and mandible prev  visualized. Heels/feet and open
         hands/5th digits prev visualized. Technically difficult due to maternal
         habitus.
Cervix Uterus Adnexa

 Cervix
 Length:           3.58  cm.
 Normal appearance by transabdominal scan.

 Uterus
 No abnormality visualized.
 Right Ovary
 Within normal limits.

 Left Ovary
 Within normal limits.

 Cul De Sac
 No free fluid seen.

 Adnexa
 No abnormality visualized.
Comments

 This patient was seen for a follow up exam as the views of
 the fetal anatomy were unable to be fully visualized during
 her last exam.  She denies any problems since her last exam.
 She was informed that the fetal growth and amniotic fluid
 level appears appropriate for her gestational age.
 The views of the fetal anatomy were visualized today.  There
 were no obvious anomalies noted.
 The limitations of ultrasound in the detection of all anomalies
 was discussed.
 Due to maternal obesity, a follow-up growth scan was
 scheduled in 5 weeks.

## 2022-02-27 NOTE — Progress Notes (Signed)
? ?  PRENATAL VISIT NOTE ? ?Subjective:  ?Erika Christian is a 26 y.o. G2P1001 at [redacted]w[redacted]d being seen today for ongoing prenatal care.  She is currently monitored for the following issues for this high-risk pregnancy and has Supervision of high risk pregnancy, antepartum; BMI 40.0-44.9, adult ([place]); History of pre-eclampsia; Rubella non-immune status, antepartum; and Recurrent UTI on their problem list. ? ?Patient reports no complaints.  Contractions: Not present. Vag. Bleeding: None.  Movement: Present. Denies leaking of fluid.  ? ?The following portions of the patient's history were reviewed and updated as appropriate: allergies, current medications, past family history, past medical history, past social history, past surgical history and problem list.  ? ?Objective:  ? ?Vitals:  ? 02/27/22 1249  ?BP: 129/82  ?Pulse: 98  ?Weight: 203 lb 3.2 oz (92.2 kg)  ? ? ?Fetal Status: Fetal Heart Rate (bpm): 141 Fundal Height: 25 cm Movement: Present    ? ?General:  Alert, oriented and cooperative. Patient is in no acute distress.  ?Skin: Skin is warm and dry. No rash noted.   ?Cardiovascular: Normal heart rate noted  ?Respiratory: Normal respiratory effort, no problems with respiration noted  ?Abdomen: Soft, gravid, appropriate for gestational age.  Pain/Pressure: Absent     ?Pelvic: Cervical exam deferred        ?Extremities: Normal range of motion.  Edema: None  ?Mental Status: Normal mood and affect. Normal behavior. Normal judgment and thought content.  ? ?Assessment and Plan:  ?Pregnancy: G2P1001 at [redacted]w[redacted]d ?1. [redacted] weeks gestation of pregnancy ?- on PNV and baby ASA.  Discussed with pt importance of baby ASA for prevention of hypertensive disorders in pregnancy.  Pt states will get some today and restart. ?- Horizon maternal screening has not been completed.  Discussed with pt.  Will do with 28 week labs ? ?2. Supervision of high risk pregnancy, antepartum ?- has follow up growth scan/anatomy scheduled ? ?3. Recurrent UTI ?-  Urine Culture obtained today ? ?4. Rubella non-immune status, antepartum ? ?5. History of pre-eclampsia ? ?6.  BMI 40.0 - 44.9 ? ?Preterm labor symptoms and general obstetric precautions including but not limited to vaginal bleeding, contractions, leaking of fluid and fetal movement were reviewed in detail with the patient. ?Please refer to After Visit Summary for other counseling recommendations.  ? ?Return in about 4 weeks (around 03/27/2022) for 2 hr GTT, 3rd trimester labs (RPR, HIV, CBC), Tdap. ? ?Future Appointments  ?Date Time Provider [place]  ?03/30/2022  8:15 AM Megan Salon, MD [facility]  ?04/05/2022  9:30 AM [facility] NURSE [facility]  ?04/05/2022  9:45 AM [facility] US5 [facility]  ?04/17/2022  9:30 AM Megan Salon, MD [facility]  ?05/01/2022  8:45 AM Megan Salon, MD [facility]  ?05/15/2022 10:45 AM Megan Salon, MD [facility]  ?05/29/2022  8:45 AM Megan Salon, MD [facility]  ?06/05/2022 10:15 AM Megan Salon, MD [facility]  ?06/12/2022  8:45 AM Megan Salon, MD [facility]  ?06/26/2022 10:45 AM Megan Salon, MD [facility]  ? ? ?Megan Salon, MD  ?

## 2022-03-02 ENCOUNTER — Encounter (HOSPITAL_BASED_OUTPATIENT_CLINIC_OR_DEPARTMENT_OTHER): Payer: Medicaid Other | Admitting: Obstetrics & Gynecology

## 2022-03-04 ENCOUNTER — Encounter (HOSPITAL_BASED_OUTPATIENT_CLINIC_OR_DEPARTMENT_OTHER): Payer: Self-pay | Admitting: Obstetrics & Gynecology

## 2022-03-04 LAB — URINE CULTURE

## 2022-03-06 ENCOUNTER — Other Ambulatory Visit (HOSPITAL_BASED_OUTPATIENT_CLINIC_OR_DEPARTMENT_OTHER): Payer: Self-pay | Admitting: Obstetrics & Gynecology

## 2022-03-07 ENCOUNTER — Encounter (HOSPITAL_BASED_OUTPATIENT_CLINIC_OR_DEPARTMENT_OTHER): Payer: Self-pay | Admitting: Obstetrics & Gynecology

## 2022-03-30 ENCOUNTER — Ambulatory Visit (INDEPENDENT_AMBULATORY_CARE_PROVIDER_SITE_OTHER): Payer: Medicaid Other | Admitting: Obstetrics & Gynecology

## 2022-03-30 VITALS — BP 128/75 | HR 103 | Wt 205.0 lb

## 2022-03-30 DIAGNOSIS — O09892 Supervision of other high risk pregnancies, second trimester: Secondary | ICD-10-CM

## 2022-03-30 DIAGNOSIS — Z315 Encounter for genetic counseling: Secondary | ICD-10-CM | POA: Diagnosis not present

## 2022-03-30 DIAGNOSIS — Z8759 Personal history of other complications of pregnancy, childbirth and the puerperium: Secondary | ICD-10-CM

## 2022-03-30 DIAGNOSIS — O0992 Supervision of high risk pregnancy, unspecified, second trimester: Secondary | ICD-10-CM | POA: Diagnosis not present

## 2022-03-30 DIAGNOSIS — O99212 Obesity complicating pregnancy, second trimester: Secondary | ICD-10-CM

## 2022-03-30 DIAGNOSIS — O99210 Obesity complicating pregnancy, unspecified trimester: Secondary | ICD-10-CM

## 2022-03-30 DIAGNOSIS — Z2839 Other underimmunization status: Secondary | ICD-10-CM

## 2022-03-30 DIAGNOSIS — Z3A27 27 weeks gestation of pregnancy: Secondary | ICD-10-CM

## 2022-03-30 DIAGNOSIS — O09899 Supervision of other high risk pregnancies, unspecified trimester: Secondary | ICD-10-CM

## 2022-03-30 NOTE — Progress Notes (Signed)
   PRENATAL VISIT NOTE  Subjective:  Erika Christian is a 26 y.o. G2P1001 at [redacted]w[redacted]d being seen today for ongoing prenatal care.  She is currently monitored for the following issues for this high-risk pregnancy and has Supervision of high risk pregnancy, antepartum; BMI 40.0-44.9, adult ([facility]CC); History of severe pre-eclampsia; Rubella non-immune status, antepartum; and Recurrent UTI on their problem list.  Patient reports no complaints.  Contractions: Not present. Vag. Bleeding: None.  Movement: Present. Denies leaking of fluid.  She is checking her blood pressures and entering them into [employer].  The following portions of the patient's history were reviewed and updated as appropriate: allergies, current medications, past family history, past medical history, past social history, past surgical history and problem list.   Objective:   Vitals:   03/30/22 0858  BP: 128/75  Pulse: (!) 103  Weight: 205 lb (93 kg)    Fetal Status: Fetal Heart Rate (bpm): 146   Movement: Present     General:  Alert, oriented and cooperative. Patient is in no acute distress.  Skin: Skin is warm and dry. No rash noted.   Cardiovascular: Normal heart rate noted  Respiratory: Normal respiratory effort, no problems with respiration noted  Abdomen: Soft, gravid, appropriate for gestational age.  Pain/Pressure: Absent     Pelvic: Cervical exam deferred        Extremities: Normal range of motion.  Edema: None  Mental Status: Normal mood and affect. Normal behavior. Normal judgment and thought content.   Assessment and Plan:  Pregnancy: G2P1001 at [redacted]w[redacted]d 1. [redacted] weeks gestation of pregnancy - on PNV and baby ASA  2. Supervision of high risk pregnancy in second trimester - CBC - Glucose Tolerance, 2 Hours w/1 Hour - HIV Antibody (routine testing w rflx) - RPR - Tdap discussed - Horizon obtained.  Was not done with initial prenatal lab work.  3. History of severe pre-eclampsia - will need 32 week growth  scan - pt is checking blood pressures  4. Rubella non-immune status, antepartum - will vaccinate after pregnancy  5. Obesity in pregnancy - has follow up ultrasound scheduled with MFM on 03/26/2022.   Preterm labor symptoms and general obstetric precautions including but not limited to vaginal bleeding, contractions, leaking of fluid and fetal movement were reviewed in detail with the patient. Please refer to After Visit Summary for other counseling recommendations.   Return in about 4 weeks (around 04/27/2022).  Future Appointments  Date Time Provider Department Center  04/05/2022  9:30 AM [facility] NURSE [facility]  04/05/2022  9:45 AM [facility] US5 [facility]  04/17/2022  9:30 AM Jerene Bears, MD [facility]  05/01/2022  8:45 AM Jerene Bears, MD [facility]  05/15/2022 10:45 AM Jerene Bears, MD [facility]  05/29/2022  8:45 AM Jerene Bears, MD [facility]  06/05/2022 10:15 AM Jerene Bears, MD [facility]  06/13/2022  8:55 AM Courtney Paris, Wilmer Floor, CNM [facility]  06/26/2022 10:45 AM Jerene Bears, MD [facility]    Jerene Bears, MD

## 2022-03-31 ENCOUNTER — Encounter (HOSPITAL_BASED_OUTPATIENT_CLINIC_OR_DEPARTMENT_OTHER): Payer: Self-pay | Admitting: Obstetrics & Gynecology

## 2022-03-31 LAB — CBC
Hematocrit: 32 % — ABNORMAL LOW (ref 34.0–46.6)
Hemoglobin: 10.7 g/dL — ABNORMAL LOW (ref 11.1–15.9)
MCH: 28.3 pg (ref 26.6–33.0)
MCHC: 33.4 g/dL (ref 31.5–35.7)
MCV: 85 fL (ref 79–97)
Platelets: 237 10*3/uL (ref 150–450)
RBC: 3.78 x10E6/uL (ref 3.77–5.28)
RDW: 13.8 % (ref 11.7–15.4)
WBC: 12 10*3/uL — ABNORMAL HIGH (ref 3.4–10.8)

## 2022-03-31 LAB — GLUCOSE TOLERANCE, 2 HOURS W/ 1HR
Glucose, 1 hour: 124 mg/dL (ref 70–179)
Glucose, 2 hour: 116 mg/dL (ref 70–152)
Glucose, Fasting: 78 mg/dL (ref 70–91)

## 2022-03-31 LAB — RPR: RPR Ser Ql: NONREACTIVE

## 2022-03-31 LAB — HIV ANTIBODY (ROUTINE TESTING W REFLEX): HIV Screen 4th Generation wRfx: NONREACTIVE

## 2022-04-05 ENCOUNTER — Ambulatory Visit: Payer: Medicaid Other | Admitting: *Deleted

## 2022-04-05 ENCOUNTER — Ambulatory Visit: Payer: Medicaid Other | Attending: Obstetrics

## 2022-04-05 ENCOUNTER — Other Ambulatory Visit: Payer: Self-pay | Admitting: *Deleted

## 2022-04-05 ENCOUNTER — Encounter: Payer: Self-pay | Admitting: *Deleted

## 2022-04-05 VITALS — BP 129/74 | HR 102

## 2022-04-05 DIAGNOSIS — O09293 Supervision of pregnancy with other poor reproductive or obstetric history, third trimester: Secondary | ICD-10-CM | POA: Insufficient documentation

## 2022-04-05 DIAGNOSIS — O99213 Obesity complicating pregnancy, third trimester: Secondary | ICD-10-CM | POA: Insufficient documentation

## 2022-04-05 DIAGNOSIS — E669 Obesity, unspecified: Secondary | ICD-10-CM

## 2022-04-05 DIAGNOSIS — Z6841 Body Mass Index (BMI) 40.0 and over, adult: Secondary | ICD-10-CM | POA: Insufficient documentation

## 2022-04-05 DIAGNOSIS — O0990 Supervision of high risk pregnancy, unspecified, unspecified trimester: Secondary | ICD-10-CM | POA: Insufficient documentation

## 2022-04-05 DIAGNOSIS — Z3A28 28 weeks gestation of pregnancy: Secondary | ICD-10-CM

## 2022-04-05 DIAGNOSIS — O09299 Supervision of pregnancy with other poor reproductive or obstetric history, unspecified trimester: Secondary | ICD-10-CM | POA: Insufficient documentation

## 2022-04-10 ENCOUNTER — Encounter (HOSPITAL_BASED_OUTPATIENT_CLINIC_OR_DEPARTMENT_OTHER): Payer: Self-pay | Admitting: *Deleted

## 2022-04-13 ENCOUNTER — Encounter (HOSPITAL_BASED_OUTPATIENT_CLINIC_OR_DEPARTMENT_OTHER): Payer: Self-pay | Admitting: Obstetrics & Gynecology

## 2022-04-17 ENCOUNTER — Encounter (HOSPITAL_BASED_OUTPATIENT_CLINIC_OR_DEPARTMENT_OTHER): Payer: Medicaid Other | Admitting: Obstetrics & Gynecology

## 2022-04-18 ENCOUNTER — Ambulatory Visit (INDEPENDENT_AMBULATORY_CARE_PROVIDER_SITE_OTHER): Payer: Medicaid Other | Admitting: Advanced Practice Midwife

## 2022-04-18 VITALS — BP 129/79 | HR 115 | Wt 207.4 lb

## 2022-04-18 DIAGNOSIS — Z8759 Personal history of other complications of pregnancy, childbirth and the puerperium: Secondary | ICD-10-CM

## 2022-04-18 DIAGNOSIS — Z3A30 30 weeks gestation of pregnancy: Secondary | ICD-10-CM

## 2022-04-18 DIAGNOSIS — O0993 Supervision of high risk pregnancy, unspecified, third trimester: Secondary | ICD-10-CM

## 2022-04-18 DIAGNOSIS — O0990 Supervision of high risk pregnancy, unspecified, unspecified trimester: Secondary | ICD-10-CM

## 2022-04-18 DIAGNOSIS — Z6841 Body Mass Index (BMI) 40.0 and over, adult: Secondary | ICD-10-CM

## 2022-04-18 MED ORDER — ASPIRIN 81 MG PO TBEC: 81.0000 mg | DELAYED_RELEASE_TABLET | Freq: Every day | ORAL | 12 refills | Status: DC

## 2022-04-18 MED ORDER — POLYSACCHARIDE IRON COMPLEX 150 MG PO CAPS: 150.0000 mg | ORAL_CAPSULE | ORAL | 0 refills | Status: DC

## 2022-04-18 NOTE — Progress Notes (Signed)
   PRENATAL VISIT NOTE  Subjective:  Erika Christian is a 26 y.o. G2P1001 at [redacted]w[redacted]d being seen today for ongoing prenatal care.  She is currently monitored for the following issues for this high-risk pregnancy and has Supervision of high risk pregnancy, antepartum; BMI 40.0-44.9, adult ([place]); History of severe pre-eclampsia; Rubella non-immune status, antepartum; and Recurrent UTI on their problem list.  Patient reports no complaints.  Contractions: Not present. Vag. Bleeding: None.  Movement: Present. Denies leaking of fluid.   The following portions of the patient's history were reviewed and updated as appropriate: allergies, current medications, past family history, past medical history, past social history, past surgical history and problem list. Problem list updated.  Objective:   Vitals:   04/18/22 1510  BP: 129/79  Pulse: (!) 115  Weight: 207 lb 6.4 oz (94.1 kg)    Fetal Status: Fetal Heart Rate (bpm): 151   Movement: Present     General:  Alert, oriented and cooperative. Patient is in no acute distress.  Skin: Skin is warm and dry. No rash noted.   Cardiovascular: Normal heart rate noted  Respiratory: Normal respiratory effort, no problems with respiration noted  Abdomen: Soft, gravid, appropriate for gestational age.  Pain/Pressure: Absent     Pelvic: Cervical exam deferred        Extremities: Normal range of motion.  Edema: None  Mental Status: Normal mood and affect. Normal behavior. Normal judgment and thought content.   Assessment and Plan:  Pregnancy: G2P1001 at [redacted]w[redacted]d  1. Supervision of high risk pregnancy, antepartum - No questions or concerns - Reviewed daily kick counts, impact for low kick number, indications for evaluation in MAU  2. [redacted] weeks gestation of pregnancy  - aspirin EC 81 MG tablet; Take 1 tablet (81 mg total) by mouth daily. Swallow whole.  Dispense: 30 tablet; Refill: 12  3. History of severe pre-eclampsia - Normotensive in current  pregnancy  4. BMI 40.0-44.9, adult ([place]) - Indication for serial growth scans - Fundal height not assessed  Preterm labor symptoms and general obstetric precautions including but not limited to vaginal bleeding, contractions, leaking of fluid and fetal movement were reviewed in detail with the patient. Please refer to After Visit Summary for other counseling recommendations.  Return in about 2 weeks (around 05/02/2022).  Future Appointments  Date Time Provider [place]  04/20/2022  9:45 AM Megan Salon, MD [facility]  05/01/2022  8:45 AM Megan Salon, MD [facility]  05/08/2022 10:45 AM [facility] NURSE [facility]  05/08/2022 11:00 AM [facility] US1 [facility]  05/15/2022  9:30 AM [facility] NURSE [facility]  05/15/2022  9:45 AM [facility] US5 [facility]  05/15/2022 10:45 AM Megan Salon, MD [facility]  05/29/2022  8:45 AM Megan Salon, MD [facility]  06/05/2022 10:15 AM Megan Salon, MD [facility]  06/13/2022  8:55 AM Leftwich-Kirby, Kathie Dike, CNM [facility]  06/26/2022 10:45 AM Megan Salon, MD [facility]    Darlina Rumpf, CNM

## 2022-04-20 ENCOUNTER — Encounter (HOSPITAL_BASED_OUTPATIENT_CLINIC_OR_DEPARTMENT_OTHER): Payer: Medicaid Other | Admitting: Obstetrics & Gynecology

## 2022-04-20 ENCOUNTER — Telehealth (HOSPITAL_BASED_OUTPATIENT_CLINIC_OR_DEPARTMENT_OTHER): Payer: Self-pay | Admitting: Obstetrics & Gynecology

## 2022-04-20 NOTE — Telephone Encounter (Signed)
Called patient today and left a message about missed appointment.

## 2022-05-01 ENCOUNTER — Ambulatory Visit (INDEPENDENT_AMBULATORY_CARE_PROVIDER_SITE_OTHER): Payer: Medicaid Other | Admitting: Obstetrics & Gynecology

## 2022-05-01 VITALS — BP 124/80 | HR 90 | Wt 207.8 lb

## 2022-05-01 DIAGNOSIS — Z6841 Body Mass Index (BMI) 40.0 and over, adult: Secondary | ICD-10-CM

## 2022-05-01 DIAGNOSIS — O0993 Supervision of high risk pregnancy, unspecified, third trimester: Secondary | ICD-10-CM

## 2022-05-01 DIAGNOSIS — O0990 Supervision of high risk pregnancy, unspecified, unspecified trimester: Secondary | ICD-10-CM

## 2022-05-01 DIAGNOSIS — Z3A32 32 weeks gestation of pregnancy: Secondary | ICD-10-CM

## 2022-05-01 DIAGNOSIS — Z8759 Personal history of other complications of pregnancy, childbirth and the puerperium: Secondary | ICD-10-CM

## 2022-05-01 DIAGNOSIS — O09899 Supervision of other high risk pregnancies, unspecified trimester: Secondary | ICD-10-CM

## 2022-05-01 DIAGNOSIS — Z2839 Other underimmunization status: Secondary | ICD-10-CM

## 2022-05-01 DIAGNOSIS — N390 Urinary tract infection, site not specified: Secondary | ICD-10-CM

## 2022-05-01 DIAGNOSIS — O09893 Supervision of other high risk pregnancies, third trimester: Secondary | ICD-10-CM

## 2022-05-08 ENCOUNTER — Encounter (HOSPITAL_BASED_OUTPATIENT_CLINIC_OR_DEPARTMENT_OTHER): Payer: Self-pay | Admitting: Obstetrics & Gynecology

## 2022-05-08 ENCOUNTER — Ambulatory Visit: Payer: Medicaid Other | Attending: Obstetrics

## 2022-05-08 ENCOUNTER — Other Ambulatory Visit: Payer: Self-pay | Admitting: *Deleted

## 2022-05-08 ENCOUNTER — Encounter: Payer: Self-pay | Admitting: *Deleted

## 2022-05-08 ENCOUNTER — Ambulatory Visit: Payer: Medicaid Other | Admitting: *Deleted

## 2022-05-08 VITALS — BP 121/75 | HR 105

## 2022-05-08 DIAGNOSIS — O99213 Obesity complicating pregnancy, third trimester: Secondary | ICD-10-CM | POA: Insufficient documentation

## 2022-05-08 DIAGNOSIS — O09293 Supervision of pregnancy with other poor reproductive or obstetric history, third trimester: Secondary | ICD-10-CM | POA: Insufficient documentation

## 2022-05-08 DIAGNOSIS — Z3A33 33 weeks gestation of pregnancy: Secondary | ICD-10-CM | POA: Diagnosis not present

## 2022-05-08 DIAGNOSIS — O0990 Supervision of high risk pregnancy, unspecified, unspecified trimester: Secondary | ICD-10-CM

## 2022-05-08 DIAGNOSIS — Z6841 Body Mass Index (BMI) 40.0 and over, adult: Secondary | ICD-10-CM

## 2022-05-08 DIAGNOSIS — E669 Obesity, unspecified: Secondary | ICD-10-CM

## 2022-05-15 ENCOUNTER — Ambulatory Visit: Payer: Medicaid Other | Attending: Obstetrics

## 2022-05-15 ENCOUNTER — Ambulatory Visit: Payer: Medicaid Other | Admitting: *Deleted

## 2022-05-15 ENCOUNTER — Ambulatory Visit (INDEPENDENT_AMBULATORY_CARE_PROVIDER_SITE_OTHER): Payer: Medicaid Other | Admitting: Obstetrics & Gynecology

## 2022-05-15 VITALS — BP 124/82 | HR 91 | Wt 207.4 lb

## 2022-05-15 VITALS — BP 126/78 | HR 109

## 2022-05-15 DIAGNOSIS — O99213 Obesity complicating pregnancy, third trimester: Secondary | ICD-10-CM | POA: Diagnosis not present

## 2022-05-15 DIAGNOSIS — E669 Obesity, unspecified: Secondary | ICD-10-CM | POA: Diagnosis not present

## 2022-05-15 DIAGNOSIS — O0993 Supervision of high risk pregnancy, unspecified, third trimester: Secondary | ICD-10-CM

## 2022-05-15 DIAGNOSIS — O09293 Supervision of pregnancy with other poor reproductive or obstetric history, third trimester: Secondary | ICD-10-CM | POA: Diagnosis not present

## 2022-05-15 DIAGNOSIS — Z3A34 34 weeks gestation of pregnancy: Secondary | ICD-10-CM | POA: Diagnosis not present

## 2022-05-15 DIAGNOSIS — Z2839 Other underimmunization status: Secondary | ICD-10-CM

## 2022-05-15 DIAGNOSIS — O0990 Supervision of high risk pregnancy, unspecified, unspecified trimester: Secondary | ICD-10-CM | POA: Diagnosis not present

## 2022-05-15 DIAGNOSIS — Z23 Encounter for immunization: Secondary | ICD-10-CM | POA: Diagnosis not present

## 2022-05-15 DIAGNOSIS — Z6841 Body Mass Index (BMI) 40.0 and over, adult: Secondary | ICD-10-CM

## 2022-05-15 DIAGNOSIS — Z8759 Personal history of other complications of pregnancy, childbirth and the puerperium: Secondary | ICD-10-CM

## 2022-05-15 DIAGNOSIS — O09899 Supervision of other high risk pregnancies, unspecified trimester: Secondary | ICD-10-CM

## 2022-05-15 NOTE — Progress Notes (Signed)
   PRENATAL VISIT NOTE  Subjective:  Erika Christian is a 26 y.o. G2P1001 at [redacted]w[redacted]d being seen today for ongoing prenatal care.  She is currently monitored for the following issues for this high-risk pregnancy and has Supervision of high risk pregnancy, antepartum; BMI 40.0-44.9, adult (HCC); History of severe pre-eclampsia; Rubella non-immune status, antepartum; and Recurrent UTI on their problem list.  Patient reports no complaints.  Contractions: Not present. Vag. Bleeding: None.  Movement: Present. Denies leaking of fluid.   The following portions of the patient's history were reviewed and updated as appropriate: allergies, current medications, past family history, past medical history, past social history, past surgical history and problem list.   Objective:   Vitals:   05/15/22 1118  BP: 124/82  Pulse: 91  Weight: 207 lb 6.4 oz (94.1 kg)    Fetal Status: Fetal Heart Rate (bpm): 140 Fundal Height: 36 cm Movement: Present     General:  Alert, oriented and cooperative. Patient is in no acute distress.  Skin: Skin is warm and dry. No rash noted.   Cardiovascular: Normal heart rate noted  Respiratory: Normal respiratory effort, no problems with respiration noted  Abdomen: Soft, gravid, appropriate for gestational age.  Pain/Pressure: Absent     Pelvic: Cervical exam deferred        Extremities: Normal range of motion.  Edema: Trace  Mental Status: Normal mood and affect. Normal behavior. Normal judgment and thought content.   Assessment and Plan:  Pregnancy: G2P1001 at [redacted]w[redacted]d 1. [redacted] weeks gestation of pregnancy - on PNV and baby ASA - follow up 2 weeks - had BPP this morning.  Was normal per pt.  Report not in Epic yet. Weekly BPPs with 1 4 week growth scans planned - Tdap vaccine greater than or equal to 7yo IM  2. Supervision of high risk pregnancy, antepartum  3. BMI 40.0-44.9, adult (HCC)  4. History of severe pre-eclampsia - pt checking blood pressures regularly and  these are in babyscripts section in EPIC  5. Rubella non-immune status, antepartum - will vaccinated post partum   Preterm labor symptoms and general obstetric precautions including but not limited to vaginal bleeding, contractions, leaking of fluid and fetal movement were reviewed in detail with the patient. Please refer to After Visit Summary for other counseling recommendations.   Return in about 2 weeks (around 05/29/2022).  Future Appointments  Date Time Provider Department Center  05/22/2022 10:15 AM [facility] NURSE [facility]  05/22/2022 10:30 AM [facility] US2 [facility]  05/29/2022  8:45 AM Jerene Bears, MD [facility]  05/29/2022 10:30 AM [facility] NURSE [facility]  05/29/2022 10:45 AM [facility] US4 [facility]  06/05/2022 10:15 AM Jerene Bears, MD [facility]  06/06/2022 10:15 AM [facility] NURSE [facility]  06/06/2022 10:30 AM [facility] US2 [facility]  06/13/2022  8:55 AM Leftwich-Kirby, Wilmer Floor, CNM [facility]  06/26/2022 10:45 AM Jerene Bears, MD [facility]    Jerene Bears, MD

## 2022-05-15 NOTE — Patient Instructions (Signed)
Mirena IUD

## 2022-05-22 ENCOUNTER — Ambulatory Visit: Payer: Medicaid Other | Admitting: *Deleted

## 2022-05-22 ENCOUNTER — Ambulatory Visit: Payer: Medicaid Other | Attending: Obstetrics

## 2022-05-22 ENCOUNTER — Other Ambulatory Visit: Payer: Self-pay | Admitting: Obstetrics

## 2022-05-22 VITALS — BP 127/72 | HR 101

## 2022-05-22 DIAGNOSIS — O99213 Obesity complicating pregnancy, third trimester: Secondary | ICD-10-CM | POA: Insufficient documentation

## 2022-05-22 DIAGNOSIS — O0990 Supervision of high risk pregnancy, unspecified, unspecified trimester: Secondary | ICD-10-CM | POA: Insufficient documentation

## 2022-05-22 DIAGNOSIS — E669 Obesity, unspecified: Secondary | ICD-10-CM | POA: Diagnosis not present

## 2022-05-22 DIAGNOSIS — O09293 Supervision of pregnancy with other poor reproductive or obstetric history, third trimester: Secondary | ICD-10-CM | POA: Diagnosis not present

## 2022-05-22 DIAGNOSIS — Z3A35 35 weeks gestation of pregnancy: Secondary | ICD-10-CM | POA: Diagnosis not present

## 2022-05-22 DIAGNOSIS — Z6836 Body mass index (BMI) 36.0-36.9, adult: Secondary | ICD-10-CM

## 2022-05-22 DIAGNOSIS — Z6841 Body Mass Index (BMI) 40.0 and over, adult: Secondary | ICD-10-CM

## 2022-05-22 NOTE — Procedures (Signed)
Erika Christian 05-25-1996 [redacted]w[redacted]d  Fetus A Non-Stress Test Interpretation for 05/22/22  Indication: Unsatisfactory BPP  Fetal Heart Rate A Mode: External Baseline Rate (A): 135 bpm Variability: Moderate Accelerations: 15 x 15 Decelerations: None Multiple birth?: No  Uterine Activity Mode: Palpation, Toco Contraction Frequency (min): Occas w/UI Contraction Duration (sec): 20-80 Contraction Quality: Mild Resting Tone Palpated: Relaxed Resting Time: Adequate  Interpretation (Fetal Testing) Nonstress Test Interpretation: Reactive Comments: Dr. Judeth Cornfield reviewed tracing.

## 2022-05-29 ENCOUNTER — Ambulatory Visit (INDEPENDENT_AMBULATORY_CARE_PROVIDER_SITE_OTHER): Payer: Medicaid Other | Admitting: Obstetrics & Gynecology

## 2022-05-29 ENCOUNTER — Ambulatory Visit: Payer: Medicaid Other

## 2022-05-29 ENCOUNTER — Other Ambulatory Visit (HOSPITAL_COMMUNITY)
Admission: RE | Admit: 2022-05-29 | Discharge: 2022-05-29 | Disposition: A | Discharge status: Home / Self Care | Payer: Medicaid Other | Source: Ambulatory Visit | Attending: Obstetrics & Gynecology | Admitting: Obstetrics & Gynecology

## 2022-05-29 VITALS — BP 133/85 | HR 104 | Wt 211.2 lb

## 2022-05-29 DIAGNOSIS — Z6841 Body Mass Index (BMI) 40.0 and over, adult: Secondary | ICD-10-CM

## 2022-05-29 DIAGNOSIS — O0993 Supervision of high risk pregnancy, unspecified, third trimester: Secondary | ICD-10-CM

## 2022-05-29 DIAGNOSIS — O0990 Supervision of high risk pregnancy, unspecified, unspecified trimester: Secondary | ICD-10-CM

## 2022-05-29 DIAGNOSIS — Z2839 Other underimmunization status: Secondary | ICD-10-CM

## 2022-05-29 DIAGNOSIS — Z3A36 36 weeks gestation of pregnancy: Secondary | ICD-10-CM

## 2022-05-29 DIAGNOSIS — Z8759 Personal history of other complications of pregnancy, childbirth and the puerperium: Secondary | ICD-10-CM

## 2022-05-29 DIAGNOSIS — O09899 Supervision of other high risk pregnancies, unspecified trimester: Secondary | ICD-10-CM

## 2022-05-29 NOTE — Progress Notes (Signed)
   PRENATAL VISIT NOTE  Subjective:  Erika Christian is a 26 y.o. G2P1001 at [redacted]w[redacted]d being seen today for ongoing prenatal care.  She is currently monitored for the following issues for this high-risk pregnancy and has Supervision of high risk pregnancy, antepartum; BMI 40.0-44.9, adult (HCC); History of severe pre-eclampsia; Rubella non-immune status, antepartum; and Recurrent UTI on their problem list.  Patient reports some mild swelling in hands and feet.  Contractions: Irregular. Vag. Bleeding: None.  Movement: Present. Denies leaking of fluid.   The following portions of the patient's history were reviewed and updated as appropriate: allergies, current medications, past family history, past medical history, past social history, past surgical history and problem list.   Objective:   Vitals:   05/29/22 0853  BP: 133/85  Pulse: (!) 104  Weight: 211 lb 3.2 oz (95.8 kg)    Fetal Status: Fetal Heart Rate (bpm): 140   Movement: Present  Presentation: Vertex  General:  Alert, oriented and cooperative. Patient is in no acute distress.  Skin: Skin is warm and dry. No rash noted.   Cardiovascular: Normal heart rate noted  Respiratory: Normal respiratory effort, no problems with respiration noted  Abdomen: Soft, gravid, appropriate for gestational age.  Pain/Pressure: Absent     Pelvic: Cervical exam performed in the presence of a chaperone Dilation: 1 Effacement (%): 30 Station: -3  Extremities: Normal range of motion.  Edema: Trace  Mental Status: Normal mood and affect. Normal behavior. Normal judgment and thought content.   Assessment and Plan:  Pregnancy: G2P1001 at [redacted]w[redacted]d 1. Supervision of high risk pregnancy in third trimester - on iron, PNV and baby ASA - recheck 1 week - Culture, beta strep (group b only) - Cervicovaginal ancillary only( [place])  2. [redacted] weeks gestation of pregnancy  3. BMI 40.0-44.9, adult (HCC) - has been having q 4 week growth scans  4. History of  severe pre-eclampsia - weekly BPPs being done.  Next is tomorrow. - she has been checking BPs at home but will increase frequency  5. Rubella non-immune status, antepartum - received Rhogam at 18 weks  Preterm labor symptoms and general obstetric precautions including but not limited to vaginal bleeding, contractions, leaking of fluid and fetal movement were reviewed in detail with the patient. Please refer to After Visit Summary for other counseling recommendations.   Return in about 1 week (around 06/05/2022).  Future Appointments  Date Time Provider Department Center  05/30/2022  1:45 PM [facility] NURSE [facility]  05/30/2022  2:00 PM [facility] US1 [facility]  06/05/2022 10:15 AM Jerene Bears, MD [facility]  06/06/2022 10:15 AM [facility] NURSE [facility]  06/06/2022 10:30 AM [facility] US2 [facility]  06/13/2022  8:55 AM Leftwich-Kirby, Wilmer Floor, CNM [facility]  06/26/2022 10:45 AM Jerene Bears, MD [facility]    Jerene Bears, MD

## 2022-05-30 ENCOUNTER — Ambulatory Visit: Payer: Medicaid Other | Attending: Obstetrics

## 2022-05-30 ENCOUNTER — Other Ambulatory Visit: Payer: Self-pay | Admitting: Obstetrics

## 2022-05-30 ENCOUNTER — Ambulatory Visit: Payer: Medicaid Other | Admitting: *Deleted

## 2022-05-30 ENCOUNTER — Encounter: Payer: Self-pay | Admitting: *Deleted

## 2022-05-30 VITALS — BP 126/76 | HR 91

## 2022-05-30 DIAGNOSIS — O99213 Obesity complicating pregnancy, third trimester: Secondary | ICD-10-CM | POA: Diagnosis not present

## 2022-05-30 DIAGNOSIS — O0990 Supervision of high risk pregnancy, unspecified, unspecified trimester: Secondary | ICD-10-CM | POA: Diagnosis not present

## 2022-05-30 DIAGNOSIS — Z6841 Body Mass Index (BMI) 40.0 and over, adult: Secondary | ICD-10-CM

## 2022-05-30 DIAGNOSIS — E669 Obesity, unspecified: Secondary | ICD-10-CM

## 2022-05-30 DIAGNOSIS — O09293 Supervision of pregnancy with other poor reproductive or obstetric history, third trimester: Secondary | ICD-10-CM | POA: Insufficient documentation

## 2022-05-30 DIAGNOSIS — Z3A36 36 weeks gestation of pregnancy: Secondary | ICD-10-CM | POA: Diagnosis not present

## 2022-05-30 LAB — CERVICOVAGINAL ANCILLARY ONLY
Chlamydia: NEGATIVE
Comment: NEGATIVE
Comment: NORMAL
Neisseria Gonorrhea: NEGATIVE

## 2022-05-30 NOTE — Procedures (Signed)
Cybele Maule August 06, 1996 [redacted]w[redacted]d  Fetus A Non-Stress Test Interpretation for 05/30/22  Indication: Unsatisfactory BPP  Fetal Heart Rate A Mode: External Baseline Rate (A): 135 bpm Variability: Moderate Accelerations: 15 x 15 Decelerations: None  Uterine Activity Mode: Palpation, Toco Contraction Frequency (min): UI Contraction Quality: Mild Resting Tone Palpated: Relaxed Resting Time: Adequate  Interpretation (Fetal Testing) Nonstress Test Interpretation: Reactive Comments: Dr. Judeth Cornfield reviewed tracing.

## 2022-06-02 LAB — CULTURE, BETA STREP (GROUP B ONLY): Strep Gp B Culture: NEGATIVE

## 2022-06-05 ENCOUNTER — Ambulatory Visit: Payer: Medicaid Other

## 2022-06-05 ENCOUNTER — Encounter (HOSPITAL_BASED_OUTPATIENT_CLINIC_OR_DEPARTMENT_OTHER): Payer: Self-pay | Admitting: Obstetrics & Gynecology

## 2022-06-05 ENCOUNTER — Ambulatory Visit (INDEPENDENT_AMBULATORY_CARE_PROVIDER_SITE_OTHER): Payer: Medicaid Other | Admitting: Obstetrics & Gynecology

## 2022-06-05 VITALS — BP 139/82 | HR 106 | Wt 211.0 lb

## 2022-06-05 DIAGNOSIS — O0993 Supervision of high risk pregnancy, unspecified, third trimester: Secondary | ICD-10-CM

## 2022-06-05 DIAGNOSIS — O09899 Supervision of other high risk pregnancies, unspecified trimester: Secondary | ICD-10-CM | POA: Diagnosis not present

## 2022-06-05 DIAGNOSIS — Z8759 Personal history of other complications of pregnancy, childbirth and the puerperium: Secondary | ICD-10-CM | POA: Diagnosis not present

## 2022-06-05 DIAGNOSIS — O99013 Anemia complicating pregnancy, third trimester: Secondary | ICD-10-CM

## 2022-06-05 DIAGNOSIS — Z2839 Other underimmunization status: Secondary | ICD-10-CM

## 2022-06-05 DIAGNOSIS — Z3A37 37 weeks gestation of pregnancy: Secondary | ICD-10-CM

## 2022-06-05 DIAGNOSIS — O0990 Supervision of high risk pregnancy, unspecified, unspecified trimester: Secondary | ICD-10-CM

## 2022-06-05 DIAGNOSIS — Z6841 Body Mass Index (BMI) 40.0 and over, adult: Secondary | ICD-10-CM | POA: Diagnosis not present

## 2022-06-06 ENCOUNTER — Encounter (HOSPITAL_COMMUNITY): Payer: Self-pay | Admitting: *Deleted

## 2022-06-06 ENCOUNTER — Ambulatory Visit: Payer: Medicaid Other | Attending: Obstetrics

## 2022-06-06 ENCOUNTER — Other Ambulatory Visit (HOSPITAL_BASED_OUTPATIENT_CLINIC_OR_DEPARTMENT_OTHER): Payer: Self-pay | Admitting: Obstetrics & Gynecology

## 2022-06-06 ENCOUNTER — Ambulatory Visit: Payer: Medicaid Other | Admitting: *Deleted

## 2022-06-06 ENCOUNTER — Telehealth (HOSPITAL_COMMUNITY): Payer: Self-pay | Admitting: *Deleted

## 2022-06-06 VITALS — BP 129/62 | HR 91

## 2022-06-06 DIAGNOSIS — O99213 Obesity complicating pregnancy, third trimester: Secondary | ICD-10-CM | POA: Insufficient documentation

## 2022-06-06 DIAGNOSIS — O99013 Anemia complicating pregnancy, third trimester: Secondary | ICD-10-CM | POA: Insufficient documentation

## 2022-06-06 DIAGNOSIS — Z8759 Personal history of other complications of pregnancy, childbirth and the puerperium: Secondary | ICD-10-CM

## 2022-06-06 DIAGNOSIS — Z3490 Encounter for supervision of normal pregnancy, unspecified, unspecified trimester: Secondary | ICD-10-CM

## 2022-06-06 DIAGNOSIS — O0990 Supervision of high risk pregnancy, unspecified, unspecified trimester: Secondary | ICD-10-CM

## 2022-06-06 DIAGNOSIS — O09293 Supervision of pregnancy with other poor reproductive or obstetric history, third trimester: Secondary | ICD-10-CM | POA: Insufficient documentation

## 2022-06-06 DIAGNOSIS — Z3A37 37 weeks gestation of pregnancy: Secondary | ICD-10-CM | POA: Insufficient documentation

## 2022-06-06 DIAGNOSIS — E669 Obesity, unspecified: Secondary | ICD-10-CM

## 2022-06-06 DIAGNOSIS — Z3A38 38 weeks gestation of pregnancy: Secondary | ICD-10-CM

## 2022-06-06 DIAGNOSIS — Z6841 Body Mass Index (BMI) 40.0 and over, adult: Secondary | ICD-10-CM | POA: Insufficient documentation

## 2022-06-06 NOTE — Telephone Encounter (Signed)
Preadmission screen  

## 2022-06-06 NOTE — Progress Notes (Signed)
   PRENATAL VISIT NOTE  Subjective:  Erika Christian is a 26 y.o. G2P1001 at [redacted]w[redacted]d being seen today for ongoing prenatal care.  She is currently monitored for the following issues for this high-risk pregnancy and has Supervision of high risk pregnancy, antepartum; BMI 40.0-44.9, adult (HCC); History of severe pre-eclampsia; Rubella non-immune status, antepartum; and Recurrent UTI on their problem list.  Patient reports no complaints.  Denies headache, visual changes or other neurologic complaints.  Has started having a little more swelling in feet/hands.  She is checking her blood pressures at home.    Having weekly BPPs.  Scheduled tomorrow with MFM.  Contractions: Irregular. Vag. Bleeding: None.  Movement: Present. Denies leaking of fluid.   The following portions of the patient's history were reviewed and updated as appropriate: allergies, current medications, past family history, past medical history, past social history, past surgical history and problem list.   Objective:   Vitals:   06/05/22 1014  BP: 139/82  Pulse: (!) 106  Weight: 211 lb (95.7 kg)    Fetal Status: Fetal Heart Rate (bpm): 152 Fundal Height: 39 cm Movement: Present  Presentation: Vertex  General:  Alert, oriented and cooperative. Patient is in no acute distress.  Skin: Skin is warm and dry. No rash noted.   Cardiovascular: Normal heart rate noted  Respiratory: Normal respiratory effort, no problems with respiration noted  Abdomen: Soft, gravid, appropriate for gestational age.  Pain/Pressure: Present     Pelvic: Cervical exam performed in the presence of a chaperone Dilation: 1.5 Effacement (%): 30 Station: -3  Extremities: Normal range of motion.  Edema: Trace  Mental Status: Normal mood and affect. Normal behavior. Normal judgment and thought content.   Assessment and Plan:  Pregnancy: G2P1001 at [redacted]w[redacted]d 1. Supervision of high risk pregnancy, antepartum - on PNV and baby ASA - Has BPP scheduled  tomorrow - induction recommended by MFM at 38-39 weeks.  Pt developed severe preeclampsia with prior pregnancy around 38 weeks.  She desires induction at 38 weeks.  Will be scheduled for her.  2. BMI 40.0-44.9, adult (HCC) - growth scan 7/3 with EFW 1948g, 22%tile  3. History of severe pre-eclampsia - pt will continue to check BP daily.  Signs and symptoms discussed.  4. Rubella non-immune status, antepartum - plan to re-vaccinate post partum  5. Anemia affecting pregnancy in third trimester - on iron   Preterm labor symptoms and general obstetric precautions including but not limited to vaginal bleeding, contractions, leaking of fluid and fetal movement were reviewed in detail with the patient. Please refer to After Visit Summary for other counseling recommendations.   No follow-ups on file.  Future Appointments  Date Time Provider Department Center  06/06/2022 10:15 AM [facility] NURSE [facility]  06/06/2022 10:30 AM [facility] US2 [facility]  06/13/2022  6:30 AM [facility] SCHED ROOM [facility] None    Jerene Bears, MD

## 2022-06-07 ENCOUNTER — Other Ambulatory Visit: Payer: Self-pay | Admitting: Advanced Practice Midwife

## 2022-06-08 ENCOUNTER — Telehealth (HOSPITAL_BASED_OUTPATIENT_CLINIC_OR_DEPARTMENT_OTHER): Payer: Self-pay | Admitting: *Deleted

## 2022-06-08 ENCOUNTER — Encounter (HOSPITAL_BASED_OUTPATIENT_CLINIC_OR_DEPARTMENT_OTHER): Payer: Self-pay | Admitting: Obstetrics & Gynecology

## 2022-06-08 NOTE — Telephone Encounter (Signed)
Called pt in response to elevated BP noted in babyscripts. Pt forgot to check it yesterday and so she checked it late last night. Pt denies any symptoms. Pt will recheck BP this morning and if elevated will need to go for evaluation.

## 2022-06-12 ENCOUNTER — Encounter (HOSPITAL_BASED_OUTPATIENT_CLINIC_OR_DEPARTMENT_OTHER): Payer: Medicaid Other | Admitting: Obstetrics & Gynecology

## 2022-06-13 ENCOUNTER — Inpatient Hospital Stay (HOSPITAL_COMMUNITY): Payer: Medicaid Other | Admitting: Anesthesiology

## 2022-06-13 ENCOUNTER — Encounter (HOSPITAL_BASED_OUTPATIENT_CLINIC_OR_DEPARTMENT_OTHER): Payer: Medicaid Other | Admitting: Advanced Practice Midwife

## 2022-06-13 ENCOUNTER — Other Ambulatory Visit: Payer: Self-pay

## 2022-06-13 ENCOUNTER — Inpatient Hospital Stay (HOSPITAL_COMMUNITY): Payer: Medicaid Other

## 2022-06-13 ENCOUNTER — Encounter (HOSPITAL_COMMUNITY): Payer: Self-pay | Admitting: Obstetrics & Gynecology

## 2022-06-13 ENCOUNTER — Inpatient Hospital Stay (HOSPITAL_COMMUNITY)
Admission: AD | Admit: 2022-06-13 | Discharge: 2022-06-15 | DRG: 807 | Disposition: A | Discharge status: Home / Self Care | Payer: Medicaid Other | Attending: Obstetrics and Gynecology | Admitting: Obstetrics and Gynecology

## 2022-06-13 DIAGNOSIS — O99013 Anemia complicating pregnancy, third trimester: Secondary | ICD-10-CM | POA: Diagnosis not present

## 2022-06-13 DIAGNOSIS — Z7982 Long term (current) use of aspirin: Secondary | ICD-10-CM

## 2022-06-13 DIAGNOSIS — O9902 Anemia complicating childbirth: Secondary | ICD-10-CM | POA: Diagnosis present

## 2022-06-13 DIAGNOSIS — O164 Unspecified maternal hypertension, complicating childbirth: Secondary | ICD-10-CM | POA: Diagnosis not present

## 2022-06-13 DIAGNOSIS — O26893 Other specified pregnancy related conditions, third trimester: Principal | ICD-10-CM | POA: Diagnosis present

## 2022-06-13 DIAGNOSIS — Z2839 Other underimmunization status: Secondary | ICD-10-CM

## 2022-06-13 DIAGNOSIS — O0993 Supervision of high risk pregnancy, unspecified, third trimester: Secondary | ICD-10-CM | POA: Diagnosis not present

## 2022-06-13 DIAGNOSIS — O0990 Supervision of high risk pregnancy, unspecified, unspecified trimester: Secondary | ICD-10-CM

## 2022-06-13 DIAGNOSIS — Z3A38 38 weeks gestation of pregnancy: Secondary | ICD-10-CM | POA: Diagnosis not present

## 2022-06-13 DIAGNOSIS — Z3490 Encounter for supervision of normal pregnancy, unspecified, unspecified trimester: Secondary | ICD-10-CM

## 2022-06-13 DIAGNOSIS — Z8759 Personal history of other complications of pregnancy, childbirth and the puerperium: Secondary | ICD-10-CM

## 2022-06-13 LAB — TYPE AND SCREEN
ABO/RH(D): O POS
Antibody Screen: NEGATIVE

## 2022-06-13 LAB — CBC
HCT: 32.6 % — ABNORMAL LOW (ref 36.0–46.0)
Hemoglobin: 10.7 g/dL — ABNORMAL LOW (ref 12.0–15.0)
MCH: 27.7 pg (ref 26.0–34.0)
MCHC: 32.8 g/dL (ref 30.0–36.0)
MCV: 84.5 fL (ref 80.0–100.0)
Platelets: 240 10*3/uL (ref 150–400)
RBC: 3.86 MIL/uL — ABNORMAL LOW (ref 3.87–5.11)
RDW: 14.3 % (ref 11.5–15.5)
WBC: 12.9 10*3/uL — ABNORMAL HIGH (ref 4.0–10.5)
nRBC: 0 % (ref 0.0–0.2)

## 2022-06-13 LAB — RPR: RPR Ser Ql: NONREACTIVE

## 2022-06-13 MED ORDER — ONDANSETRON HCL 4 MG PO TABS: 4.0000 mg | ORAL_TABLET | ORAL | Status: DC | PRN

## 2022-06-13 MED ORDER — SIMETHICONE 80 MG PO CHEW: 80.0000 mg | CHEWABLE_TABLET | ORAL | Status: DC | PRN

## 2022-06-13 MED ORDER — EPHEDRINE 5 MG/ML INJ: 10.0000 mg | INTRAVENOUS | Status: DC | PRN

## 2022-06-13 MED ORDER — COCONUT OIL OIL: 1.0000 | TOPICAL_OIL | Status: DC | PRN

## 2022-06-13 MED ORDER — OXYCODONE HCL 5 MG PO TABS: 5.0000 mg | ORAL_TABLET | ORAL | Status: DC | PRN

## 2022-06-13 MED ORDER — OXYTOCIN-SODIUM CHLORIDE 30-0.9 UT/500ML-% IV SOLN
2.5000 [IU]/h | INTRAVENOUS | Status: DC
  Administered 2022-06-13: 2.5 [IU]/h via INTRAVENOUS
  Filled 2022-06-13: qty 500

## 2022-06-13 MED ORDER — LIDOCAINE HCL (PF) 1 % IJ SOLN: 30.0000 mL | INTRAMUSCULAR | Status: DC | PRN

## 2022-06-13 MED ORDER — FENTANYL-BUPIVACAINE-NACL 0.5-0.125-0.9 MG/250ML-% EP SOLN: 12.0000 mL/h | EPIDURAL | Status: DC | PRN

## 2022-06-13 MED ORDER — DIBUCAINE (PERIANAL) 1 % EX OINT: 1.0000 | TOPICAL_OINTMENT | CUTANEOUS | Status: DC | PRN

## 2022-06-13 MED ORDER — OXYCODONE-ACETAMINOPHEN 5-325 MG PO TABS: 2.0000 | ORAL_TABLET | ORAL | Status: DC | PRN

## 2022-06-13 MED ORDER — PHENYLEPHRINE 80 MCG/ML (10ML) SYRINGE FOR IV PUSH (FOR BLOOD PRESSURE SUPPORT)
80.0000 ug | PREFILLED_SYRINGE | INTRAVENOUS | Status: DC | PRN
  Filled 2022-06-13: qty 10

## 2022-06-13 MED ORDER — PHENYLEPHRINE 80 MCG/ML (10ML) SYRINGE FOR IV PUSH (FOR BLOOD PRESSURE SUPPORT): 80.0000 ug | PREFILLED_SYRINGE | INTRAVENOUS | Status: DC | PRN

## 2022-06-13 MED ORDER — OXYCODONE HCL 5 MG PO TABS: 10.0000 mg | ORAL_TABLET | ORAL | Status: DC | PRN

## 2022-06-13 MED ORDER — SODIUM CHLORIDE 0.9% FLUSH: 3.0000 mL | INTRAVENOUS | Status: DC | PRN

## 2022-06-13 MED ORDER — OXYTOCIN-SODIUM CHLORIDE 30-0.9 UT/500ML-% IV SOLN
1.0000 m[IU]/min | INTRAVENOUS | Status: DC
  Administered 2022-06-13 (×2): 2 m[IU]/min via INTRAVENOUS

## 2022-06-13 MED ORDER — SODIUM BICARBONATE 8.4 % IV SOLN
INTRAVENOUS | Status: DC | PRN
  Administered 2022-06-13: 5 mL via EPIDURAL

## 2022-06-13 MED ORDER — LACTATED RINGERS IV SOLN: 500.0000 mL | INTRAVENOUS | Status: DC | PRN

## 2022-06-13 MED ORDER — TERBUTALINE SULFATE 1 MG/ML IJ SOLN
0.2500 mg | Freq: Once | INTRAMUSCULAR | Status: AC | PRN
  Administered 2022-06-13: 0.25 mg via SUBCUTANEOUS

## 2022-06-13 MED ORDER — ACETAMINOPHEN 325 MG PO TABS: 650.0000 mg | ORAL_TABLET | ORAL | Status: DC | PRN

## 2022-06-13 MED ORDER — SODIUM CHLORIDE 0.9% FLUSH
3.0000 mL | Freq: Two times a day (BID) | INTRAVENOUS | Status: DC
  Administered 2022-06-14 (×2): 3 mL via INTRAVENOUS

## 2022-06-13 MED ORDER — SODIUM CHLORIDE 0.9 % IV SOLN: INTRAVENOUS | Status: DC | PRN

## 2022-06-13 MED ORDER — FENTANYL-BUPIVACAINE-NACL 0.5-0.125-0.9 MG/250ML-% EP SOLN
12.0000 mL/h | EPIDURAL | Status: DC | PRN
  Administered 2022-06-13: 12 mL/h via EPIDURAL
  Filled 2022-06-13: qty 250

## 2022-06-13 MED ORDER — WITCH HAZEL-GLYCERIN EX PADS: 1.0000 | MEDICATED_PAD | CUTANEOUS | Status: DC | PRN

## 2022-06-13 MED ORDER — IBUPROFEN 600 MG PO TABS
600.0000 mg | ORAL_TABLET | Freq: Four times a day (QID) | ORAL | Status: DC
Start: 2022-06-14 — End: 2022-06-15
  Administered 2022-06-14 – 2022-06-15 (×6): 600 mg via ORAL
  Filled 2022-06-13 (×6): qty 1

## 2022-06-13 MED ORDER — MEASLES, MUMPS & RUBELLA VAC IJ SOLR: 0.5000 mL | Freq: Once | INTRAMUSCULAR | Status: DC

## 2022-06-13 MED ORDER — ONDANSETRON HCL 4 MG/2ML IJ SOLN: 4.0000 mg | INTRAMUSCULAR | Status: DC | PRN

## 2022-06-13 MED ORDER — MISOPROSTOL 50MCG HALF TABLET
50.0000 ug | ORAL_TABLET | ORAL | Status: DC | PRN
  Administered 2022-06-13: 50 ug via BUCCAL
  Filled 2022-06-13: qty 1

## 2022-06-13 MED ORDER — SENNOSIDES-DOCUSATE SODIUM 8.6-50 MG PO TABS
2.0000 | ORAL_TABLET | ORAL | Status: DC
Start: 2022-06-14 — End: 2022-06-15
  Administered 2022-06-14 – 2022-06-15 (×2): 2 via ORAL
  Filled 2022-06-13 (×2): qty 2

## 2022-06-13 MED ORDER — TERBUTALINE SULFATE 1 MG/ML IJ SOLN: 0.2500 mg | Freq: Once | INTRAMUSCULAR | Status: DC | PRN

## 2022-06-13 MED ORDER — BENZOCAINE-MENTHOL 20-0.5 % EX AERO: 1.0000 | INHALATION_SPRAY | CUTANEOUS | Status: DC | PRN

## 2022-06-13 MED ORDER — FLEET ENEMA 7-19 GM/118ML RE ENEM: 1.0000 | ENEMA | RECTAL | Status: DC | PRN

## 2022-06-13 MED ORDER — TERBUTALINE SULFATE 1 MG/ML IJ SOLN
0.2500 mg | Freq: Once | INTRAMUSCULAR | Status: DC | PRN
  Filled 2022-06-13: qty 1

## 2022-06-13 MED ORDER — PRENATAL MULTIVITAMIN CH
1.0000 | ORAL_TABLET | Freq: Every day | ORAL | Status: DC
Start: 2022-06-14 — End: 2022-06-15
  Administered 2022-06-14: 1 via ORAL
  Filled 2022-06-13: qty 1

## 2022-06-13 MED ORDER — DIPHENHYDRAMINE HCL 50 MG/ML IJ SOLN: 12.5000 mg | INTRAMUSCULAR | Status: DC | PRN

## 2022-06-13 MED ORDER — OXYTOCIN BOLUS FROM INFUSION
333.0000 mL | Freq: Once | INTRAVENOUS | Status: AC
  Administered 2022-06-13: 333 mL via INTRAVENOUS

## 2022-06-13 MED ORDER — LACTATED RINGERS IV SOLN: 500.0000 mL | Freq: Once | INTRAVENOUS | Status: DC

## 2022-06-13 MED ORDER — TETANUS-DIPHTH-ACELL PERTUSSIS 5-2.5-18.5 LF-MCG/0.5 IM SUSY: 0.5000 mL | PREFILLED_SYRINGE | Freq: Once | INTRAMUSCULAR | Status: DC

## 2022-06-13 MED ORDER — LACTATED RINGERS IV SOLN: INTRAVENOUS | Status: DC

## 2022-06-13 MED ORDER — ACETAMINOPHEN 325 MG PO TABS
650.0000 mg | ORAL_TABLET | ORAL | Status: DC | PRN
  Administered 2022-06-14 – 2022-06-15 (×4): 650 mg via ORAL
  Filled 2022-06-13 (×4): qty 2

## 2022-06-13 MED ORDER — DIPHENHYDRAMINE HCL 25 MG PO CAPS: 25.0000 mg | ORAL_CAPSULE | Freq: Four times a day (QID) | ORAL | Status: DC | PRN

## 2022-06-13 MED ORDER — ONDANSETRON HCL 4 MG/2ML IJ SOLN: 4.0000 mg | Freq: Four times a day (QID) | INTRAMUSCULAR | Status: DC | PRN

## 2022-06-13 MED ORDER — OXYCODONE-ACETAMINOPHEN 5-325 MG PO TABS: 1.0000 | ORAL_TABLET | ORAL | Status: DC | PRN

## 2022-06-13 MED ORDER — FENTANYL CITRATE (PF) 100 MCG/2ML IJ SOLN
100.0000 ug | INTRAMUSCULAR | Status: DC | PRN
  Administered 2022-06-13: 100 ug via INTRAVENOUS
  Filled 2022-06-13: qty 2

## 2022-06-13 MED ORDER — SOD CITRATE-CITRIC ACID 500-334 MG/5ML PO SOLN: 30.0000 mL | ORAL | Status: DC | PRN

## 2022-06-13 MED ORDER — ZOLPIDEM TARTRATE 5 MG PO TABS: 5.0000 mg | ORAL_TABLET | Freq: Every evening | ORAL | Status: DC | PRN

## 2022-06-13 NOTE — Progress Notes (Signed)
Labor Progress Note Erika Christian is a 26 y.o. G2P1001 at [redacted]w[redacted]d presented for IOL for hx severe PEC  S:  Comfortable, no complaints.   O:  BP 124/61   Pulse 87   Temp 99.3 F (37.4 C) (Oral)   Resp 16   Ht 4\' 11"  (1.499 m)   Wt 95 kg   LMP 09/19/2021 (Approximate)   BMI 42.31 kg/m  EFM: baseline 135 bpm/ mod variability/ + accels/ no decels  Toco/IUPC: 1.5-2 SVE: Dilation: 2 Effacement (%): Thick Station: -3 Presentation: Vertex Exam by:: [email or web address] CNM   A/P: 26 y.o. G2P1001 [redacted]w[redacted]d  1. Labor: latent 2. FWB: Cat I 3. Pain: analgesia/anesthesia/NO prn 4. Hx PEC: no current signs  S/p Cytotec x1. Consented for FB, pre-medicated d/t previous intolerance, FB inserted w/o difficulty. Anticipate SVD.  [redacted]w[redacted]d, CNM 11:16 AM

## 2022-06-13 NOTE — Plan of Care (Signed)

## 2022-06-13 NOTE — Anesthesia Preprocedure Evaluation (Signed)
Anesthesia Evaluation  Patient identified by MRN, date of birth, ID band Patient awake    Reviewed: Allergy & Precautions, Patient's Chart, lab work & pertinent test results  Airway Mallampati: III       Dental no notable dental hx.    Pulmonary    Pulmonary exam normal        Cardiovascular hypertension, Normal cardiovascular exam     Neuro/Psych    GI/Hepatic   Endo/Other    Renal/GU      Musculoskeletal   Abdominal   Peds  Hematology   Anesthesia Other Findings   Reproductive/Obstetrics (+) Pregnancy                             Anesthesia Physical Anesthesia Plan  ASA: 3  Anesthesia Plan: Epidural   Post-op Pain Management:    Induction:   PONV Risk Score and Plan: 0  Airway Management Planned: Natural Airway  Additional Equipment: None  Intra-op Plan:   Post-operative Plan:   Informed Consent: I have reviewed the patients History and Physical, chart, labs and discussed the procedure including the risks, benefits and alternatives for the proposed anesthesia with the patient or authorized representative who has indicated his/her understanding and acceptance.       Plan Discussed with:   Anesthesia Plan Comments: (Lab Results      Component                Value               Date                      WBC                      12.9 (H)            06/13/2022                HGB                      10.7 (L)            06/13/2022                HCT                      32.6 (L)            06/13/2022                MCV                      84.5                06/13/2022                PLT                      240                 06/13/2022           )        Anesthesia Quick Evaluation

## 2022-06-13 NOTE — Anesthesia Procedure Notes (Signed)
Epidural Patient location during procedure: OB Start time: 06/13/2022 1:47 PM End time: 06/13/2022 1:53 PM  Staffing Anesthesiologist: Shelton Silvas, MD Performed: anesthesiologist   Preanesthetic Checklist Completed: patient identified, IV checked, site marked, risks and benefits discussed, surgical consent, monitors and equipment checked, pre-op evaluation and timeout performed  Epidural Patient position: sitting Prep: DuraPrep Patient monitoring: heart rate, continuous pulse ox and blood pressure Approach: midline Location: L3-L4 Injection technique: LOR saline  Needle:  Needle type: Tuohy  Needle gauge: 17 G Needle length: 9 cm Catheter type: closed end flexible Catheter size: 20 Guage Test dose: negative and 1.5% lidocaine  Assessment Events: blood not aspirated, injection not painful, no injection resistance and no paresthesia  Additional Notes LOR @ 5.5  Patient identified. Risks/Benefits/Options discussed with patient including but not limited to bleeding, infection, nerve damage, paralysis, failed block, incomplete pain control, headache, blood pressure changes, nausea, vomiting, reactions to medications, itching and postpartum back pain. Confirmed with bedside nurse the patient's most recent platelet count. Confirmed with patient that they are not currently taking any anticoagulation, have any bleeding history or any family history of bleeding disorders. Patient expressed understanding and wished to proceed. All questions were answered. Sterile technique was used throughout the entire procedure. Please see nursing notes for vital signs. Test dose was given through epidural catheter and negative prior to continuing to dose epidural or start infusion. Warning signs of high block given to the patient including shortness of breath, tingling/numbness in hands, complete motor block, or any concerning symptoms with instructions to call for help. Patient was given instructions on  fall risk and not to get out of bed. All questions and concerns addressed with instructions to call with any issues or inadequate analgesia.    Reason for block:procedure for pain

## 2022-06-13 NOTE — Progress Notes (Signed)
Labor Progress Note Erika Christian is a 26 y.o. G2P1001 at [redacted]w[redacted]d presented for IOL for hx PEC To bedside for prolonged to decel.   S:  Comfortable with epidural.   O:  BP 115/74   Pulse 80   Temp 98.1 F (36.7 C) (Oral)   Resp 16   Ht 4\' 11"  (1.499 m)   Wt 95 kg   LMP 09/19/2021 (Approximate)   SpO2 100%   BMI 42.31 kg/m  EFM: baseline 145 bpm/ mod variability/ no accels/ prolonged decels  Toco/IUPC: q2 SVE: Dilation: 7 Effacement (%): 70 Station: -3 Presentation: Vertex Exam by:: [email or web address] CNM Pitocin: 2 mu/min  A/P: 27 y.o. G2P1001 [redacted]w[redacted]d  1. Labor: active 2. FWB: Cat II 3. Pain: epidural  Prolonged decel x7 min. Pit off, pt repositioned, and Terbutaline given with improvement and return to baseline. Suspect decel d/t rapid progression. Anticipate labor progress and SVD.  [redacted]w[redacted]d, CNM 5:12 PM

## 2022-06-13 NOTE — H&P (Cosign Needed Addendum)
OBSTETRIC ADMISSION HISTORY AND PHYSICAL  Erika Christian is a 26 y.o. female G2P1001 with IUP at [redacted]w[redacted]d by LMP presenting for IOL for Hx severe pre-E at 38 wks. She reports +FMs, No LOF, no VB, no blurry vision, headaches or peripheral edema, and RUQ pain.  She plans on breast feeding. She is undecided for birth control. She received her prenatal care at  [facility]    Dating: By LMP --->  Estimated Date of Delivery: 06/26/22  Sono:    @[redacted]w[redacted]d , CWD, normal anatomy, vertex presentation,  2880g, 32% EFW   Prenatal History/Complications:  - Hx severe Pre-E at 38 weeks  - Rubella non-immune  - anemia, on PO iron - BMI 42  Past Medical History: Past Medical History:  Diagnosis Date   Preeclampsia    Shingles     Past Surgical History: Past Surgical History:  Procedure Laterality Date   APPENDECTOMY      Obstetrical History: OB History     Gravida  2   Para  1   Term  1   Preterm  0   AB  0   Living  1      SAB  0   IAB  0   Ectopic  0   Multiple  0   Live Births  1           Social History Social History   Socioeconomic History   Marital status: Married    Spouse name: 12-03-1977   Number of children: 1   Years of education: Not on file   Highest education level: Associate degree: academic program  Occupational History   Not on file  Tobacco Use   Smoking status: Never    Passive exposure: Never   Smokeless tobacco: Never  Vaping Use   Vaping Use: Never used  Substance and Sexual Activity   Alcohol use: Not Currently   Drug use: Never   Sexual activity: Yes    Birth control/protection: None  Other Topics Concern   Not on file  Social History Narrative   Not on file   Social Determinants of Health   Financial Resource Strain: Low Risk  (12/08/2021)   Overall Financial Resource Strain (CARDIA)    Difficulty of Paying Living Expenses: Not hard at all  Food Insecurity: No Food Insecurity (12/08/2021)   Hunger Vital Sign    Worried About  Running Out of Food in the Last Year: Never true    Ran Out of Food in the Last Year: Never true  Transportation Needs: No Transportation Needs (12/08/2021)   PRAPARE - 02/05/2022 (Medical): No    Lack of Transportation (Non-Medical): No  Physical Activity: Insufficiently Active (12/08/2021)   Exercise Vital Sign    Days of Exercise per Week: 2 days    Minutes of Exercise per Session: 30 min  Stress: No Stress Concern Present (12/08/2021)   02/05/2022 of Occupational Health - Occupational Stress Questionnaire    Feeling of Stress : Not at all  Social Connections: Moderately Integrated (12/08/2021)   Social Connection and Isolation Panel [NHANES]    Frequency of Communication with Friends and Family: Twice a week    Frequency of Social Gatherings with Friends and Family: Once a week    Attends Religious Services: More than 4 times per year    Active Member of 02/05/2022 or Organizations: No    Attends [employer] Meetings: Never    Marital Status: Married  Family History: Family History  Problem Relation Age of Onset   Diabetes Paternal Grandmother    Cancer Paternal Grandmother        lung    Allergies: Allergies  Allergen Reactions   Orange Fruit [Citrus] Hives    Pt denies allergies to latex, iodine, or shellfish.  Medications Prior to Admission  Medication Sig Dispense Refill Last Dose   aspirin EC 81 MG tablet Take 1 tablet (81 mg total) by mouth daily. Swallow whole. 30 tablet 12    iron polysaccharides (NIFEREX) 150 MG capsule Take 1 capsule (150 mg total) by mouth every other day for 90 doses. 90 capsule 0    Prenatal Vit-Fe Fumarate-FA (PRENATAL MULTIVITAMIN) TABS tablet Take 1 tablet by mouth daily at 12 noon.        Review of Systems   All systems reviewed and negative except as stated in HPI  Blood pressure 135/72, pulse (!) 102, height 4\' 11"  (1.499 m), weight 95 kg, last menstrual period 09/19/2021, unknown if  currently breastfeeding. General appearance: alert and cooperative Lungs: clear to auscultation bilaterally Heart: regular rate and rhythm Abdomen: soft, non-tender; bowel sounds normal Extremities: Homans sign is negative, no sign of DVT Presentation: cephalic Fetal monitoringBaseline: 130 bpm, Variability: Good {> 6 bpm), Accelerations: Reactive, and Decelerations: Absent Uterine activityNone     Prenatal labs: ABO, Rh: O/Positive/-- (02/02 1042) Antibody: Negative (02/02 1042) Rubella: <0.90 (02/02 1042) RPR: Non Reactive (05/25 0829)  HBsAg: Negative (02/02 1042)  HIV: Non Reactive (05/25 0829)  GBS: Negative/-- (07/24 1323)  2 hr Glucola: normal Genetic screening:  low risk  Anatomy 06-23-2005: normal   Prenatal Transfer Tool  Maternal Diabetes: No Genetic Screening: Normal Maternal Ultrasounds/Referrals: Normal Fetal Ultrasounds or other Referrals:  None Maternal Substance Abuse:  No Significant Maternal Medications:  None Significant Maternal Lab Results: Group B Strep negative and Rubella non-immune   No results found for this or any previous visit (from the past 24 hour(s)).  Patient Active Problem List   Diagnosis Date Noted   [redacted] weeks gestation of pregnancy 06/13/2022   Anemia affecting pregnancy in third trimester 06/06/2022   Recurrent UTI 02/27/2022   BMI 40.0-44.9, adult (HCC) 01/06/2022   History of severe pre-eclampsia 01/06/2022   Rubella non-immune status, antepartum 01/06/2022   Supervision of high risk pregnancy, antepartum 10/24/2021    Assessment/Plan:  [name] is a 26 y.o. G2P1001 at [redacted]w[redacted]d here for IOL for Hx severe Pre-E recommended by MFM.   #Labor: buccal cytotec given 0715, planning FB after breakfast for comfort.  #Pain: Planning epidural  #FWB: Cat 1 #ID:  GBS neg, Rubella non-immune  #MOF: breast #MOC: undecided    [redacted]w[redacted]d, DO  [facility] 06/13/2022, 7:02 AM  Midwife  attestation: I have seen and examined this patient; I agree with above documentation in the resident's note.   PE: Gen: calm comfortable, NAD Resp: normal effort and rate Abd: gravid  ROS, labs, PMH reviewed  Assessment/Plan: [redacted] weeks gestation Labor: latent FWB: Cat I GBS: neg Admit to LD Cervical ripening Anticipate SVD  08/13/2022, CNM  06/13/2022, 11:28 AM

## 2022-06-13 NOTE — Progress Notes (Signed)
Labor Progress Note Erika Christian is a 26 y.o. G2P1001 at [redacted]w[redacted]d presented for IOL for hx severe Pre-E per MFM.  S: Patient is resting comfortably with epidural.  O:  BP 118/63   Pulse 90   Temp 97.6 F (36.4 C) (Axillary)   Resp 18   Ht 4\' 11"  (1.499 m)   Wt 95 kg   LMP 09/19/2021 (Approximate)   SpO2 100%   BMI 42.31 kg/m  EFM: 140/+ accelerations /no decelerations   CVE: Dilation: 5 Effacement (%): 70 Station: -3 Presentation: Vertex Exam by:: [email or web address] RN   A&P: 26 y.o. G2P1001 [redacted]w[redacted]d  #Labor: Progressing well. Good contraction pattern. Add pitocin if labor arrest occurs.  #Pain: epidural in place #FWB: Cat 1 #GBS negative   [redacted]w[redacted]d, DO [facility], [facility] 3:18 PM

## 2022-06-14 NOTE — Discharge Summary (Signed)
Postpartum Discharge Summary  Date of Service updated- yes  Patient Name: Erika Christian DOB: Feb 18, 1996 MRN: [id number]  Date of admission: 06/13/2022 Delivery date:06/13/2022  Delivering provider: Stormy Card  Date of discharge: 06/15/2022  Admitting diagnosis: [redacted] weeks gestation of pregnancy [Z3A.38] Intrauterine pregnancy: [redacted]w[redacted]d    Secondary diagnosis:  Principal Problem:   SVD (spontaneous vaginal delivery) Active Problems:   Supervision of high risk pregnancy, antepartum   History of severe pre-eclampsia   Rubella non-immune status, antepartum   Anemia affecting pregnancy in third trimester   [redacted] weeks gestation of pregnancy  Additional problems: history of pre-eclampsia in previous pregnancy    Discharge diagnosis: Term Pregnancy Delivered                                              Post partum procedures: none Augmentation: AROM, Pitocin, and Cytotec Complications: None  Hospital course: Induction of Labor With Vaginal Delivery   26y.o. yo [id number] [id number] admitted to the hospital 06/13/2022 for induction of labor.  Indication for induction:  history of pre-eclampsia in prior pregnancy .  Patient had an uncomplicated labor course as follows: Membrane Rupture Time/Date: 1:04 PM ,06/13/2022   Delivery Method:Vaginal, Spontaneous  Episiotomy: None  Lacerations:  Periurethral  Details of delivery can be found in separate delivery note.  Patient had a routine postpartum course. Patient is discharged home 06/15/22.  Newborn Data: Birth date:06/13/2022  Birth time:9:32 PM  Gender:Female  Living status:Living  Apgars:9 ,9  Weight:2780 g   Magnesium Sulfate received: No BMZ received: No Rhophylac:N/A MMR:Yes T-DaP:Given prenatally Flu: N/A Transfusion:No  Physical exam  Vitals:   06/14/22 0844 06/14/22 0923 06/14/22 2120 06/15/22 0449  BP: 139/69 125/78 (!) 112/54 115/81  Pulse: 89 78 (!) 105 76  Resp: '18 18 17 17  ' Temp: 98.6 F (37 C)  97.9 F  (36.6 C) 97.9 F (36.6 C)  TempSrc: Oral  Oral Oral  SpO2: 100% 100% 100% 100%  Weight:      Height:       General: alert, cooperative, and no distress Lochia: appropriate Uterine Fundus: firm Incision: N/A DVT Evaluation: No evidence of DVT seen on physical exam. Labs: Lab Results  Component Value Date   WBC 12.9 (H) 06/13/2022   HGB 10.7 (L) 06/13/2022   HCT 32.6 (L) 06/13/2022   MCV 84.5 06/13/2022   PLT 240 06/13/2022      Latest Ref Rng & Units 01/05/2022   11:34 AM  CMP  Glucose 70 - 99 mg/dL 75   BUN 6 - 20 mg/dL 8   Creatinine 0.57 - 1.00 mg/dL 0.52   Sodium 134 - 144 mmol/L 137   Potassium 3.5 - 5.2 mmol/L 4.3   Chloride 96 - 106 mmol/L 102   CO2 20 - 29 mmol/L 17   Calcium 8.7 - 10.2 mg/dL 10.0   Total Protein 6.0 - 8.5 g/dL 7.2   Total Bilirubin 0.0 - 1.2 mg/dL 0.2   Alkaline Phos 44 - 121 IU/L 75   AST 0 - 40 IU/L 16   ALT 0 - 32 IU/L 8    Edinburgh Score:    06/14/2022    9:27 AM  Edinburgh Postnatal Depression Scale Screening Tool  I have been able to laugh and see the funny side of things. 0  I have looked forward  with enjoyment to things. 0  I have blamed myself unnecessarily when things went wrong. 0  I have been anxious or worried for no good reason. 1  I have felt scared or panicky for no good reason. 0  Things have been getting on top of me. 0  I have been so unhappy that I have had difficulty sleeping. 0  I have felt sad or miserable. 0  I have been so unhappy that I have been crying. 0  The thought of harming myself has occurred to me. 0  Edinburgh Postnatal Depression Scale Total 1     After visit meds:  Allergies as of 06/15/2022       Reactions   Orange Fruit [citrus] Hives        Medication List     STOP taking these medications    aspirin EC 81 MG tablet   iron polysaccharides 150 MG capsule Commonly known as: NIFEREX       TAKE these medications    ferrous sulfate 325 (65 FE) MG tablet Take 325 mg by mouth  daily.   ibuprofen 600 MG tablet Commonly known as: ADVIL Take 1 tablet (600 mg total) by mouth every 6 (six) hours as needed for moderate pain.   prenatal multivitamin Tabs tablet Take 1 tablet by mouth daily.         Discharge home in stable condition Infant Feeding: Breast Infant Disposition:home with mother Discharge instruction: per After Visit Summary and Postpartum booklet. Activity: Advance as tolerated. Pelvic rest for 6 weeks.  Diet: routine diet Future Appointments: Future Appointments  Date Time Provider [place]  07/25/2022  2:55 PM Leftwich-Kirby, Kathie Dike, CNM [facility]   Follow up Visit:  Message for appointment sent to [facility] by Mikki Santee, MD Please schedule this patient for a In person postpartum visit in 6 weeks with the following provider: Any provider. Additional Postpartum F/U: none   Low risk pregnancy complicated by:  none Delivery mode:  Vaginal, Spontaneous  Anticipated Birth Control:  Unsure  Liliane Channel MD MPH OB Fellow, Faculty Practice

## 2022-06-14 NOTE — Anesthesia Postprocedure Evaluation (Signed)
Anesthesia Post Note  Patient: Jayna Mulnix  Procedure(s) Performed: AN AD HOC LABOR EPIDURAL     Patient location during evaluation: Mother Baby Anesthesia Type: Epidural Level of consciousness: awake and alert and oriented Pain management: satisfactory to patient Vital Signs Assessment: post-procedure vital signs reviewed and stable Respiratory status: respiratory function stable Cardiovascular status: stable Postop Assessment: no headache, no backache, epidural receding, patient able to bend at knees, no signs of nausea or vomiting, adequate PO intake and able to ambulate Anesthetic complications: no   No notable events documented.  Last Vitals:  Vitals:   06/14/22 0425 06/14/22 0844  BP: 132/79 139/69  Pulse: 85 89  Resp: 17 18  Temp: 36.9 C 37 C  SpO2: 100% 100%    Last Pain:  Vitals:   06/14/22 0844  TempSrc: Oral  PainSc: 2    Pain Goal:                Epidural/Spinal Function Cutaneous sensation: Normal sensation (06/14/22 0844), Patient able to flex knees: Yes (06/14/22 0844), Patient able to lift hips off bed: Yes (06/14/22 0844), Back pain beyond tenderness at insertion site: No (06/14/22 0844), Progressively worsening motor and/or sensory loss: No (06/14/22 0844), Bowel and/or bladder incontinence post epidural: No (06/14/22 0844)  Karleen Dolphin

## 2022-06-14 NOTE — Progress Notes (Addendum)
POSTPARTUM PROGRESS NOTE  Post Partum Day 1  Subjective:  Erika Christian is a 26 y.o. [id number] s/p SVD at [redacted]w[redacted]d.  No acute events overnight.  Pt denies problems with ambulating, voiding or po intake.  She denies nausea or vomiting.  Pain is moderately controlled with tylenol dose overnight.  She has not had flatus. She has not had bowel movement.  Lochia Small.   Objective: Blood pressure 132/79, pulse 85, temperature 98.4 F (36.9 C), temperature source Oral, resp. rate 17, height 4\' 11"  (1.499 m), weight 95 kg, last menstrual period 09/19/2021, SpO2 100 %, unknown if currently breastfeeding.  Physical Exam:  General: alert, cooperative and no distress Chest: no respiratory distress Heart:regular rate, distal pulses intact Abdomen: soft, nontender,  Uterine Fundus: firm, appropriately tender DVT Evaluation: No calf swelling or tenderness Extremities: LE 1+ edema Skin: warm, dry  Recent Labs    06/13/22 0645  HGB 10.7*  HCT 32.6*    Assessment/Plan: Erika Christian is a 26 y.o. 30 s/p SVD at [redacted]w[redacted]d. She reports pain that persists despite AM dose of tylenol. Will plan to switch scheduled tylenol to improve pain control.  PPD# - Doing well Contraception: would like the pill Feeding: bottle Dispo: Plan for discharge Thursday.   LOS: 1 day   Patient seen, along with Saturday, MS3  Janelle Floor MD MPH OB Fellow, Faculty Practice

## 2022-06-15 MED ORDER — IBUPROFEN 600 MG PO TABS: 600.0000 mg | ORAL_TABLET | Freq: Four times a day (QID) | ORAL | 0 refills | Status: DC | PRN

## 2022-06-17 ENCOUNTER — Encounter: Payer: Self-pay | Admitting: Obstetrics and Gynecology

## 2022-06-18 ENCOUNTER — Encounter (HOSPITAL_COMMUNITY): Payer: Self-pay | Admitting: Obstetrics and Gynecology

## 2022-06-18 ENCOUNTER — Inpatient Hospital Stay (HOSPITAL_COMMUNITY)
Admission: AD | Admit: 2022-06-18 | Discharge: 2022-06-18 | Disposition: A | Discharge status: Home / Self Care | Payer: Medicaid Other | Attending: Obstetrics and Gynecology | Admitting: Obstetrics and Gynecology

## 2022-06-18 DIAGNOSIS — Z8759 Personal history of other complications of pregnancy, childbirth and the puerperium: Secondary | ICD-10-CM | POA: Insufficient documentation

## 2022-06-18 DIAGNOSIS — O1495 Unspecified pre-eclampsia, complicating the puerperium: Secondary | ICD-10-CM | POA: Diagnosis not present

## 2022-06-18 DIAGNOSIS — O165 Unspecified maternal hypertension, complicating the puerperium: Secondary | ICD-10-CM

## 2022-06-18 DIAGNOSIS — Z79899 Other long term (current) drug therapy: Secondary | ICD-10-CM | POA: Diagnosis not present

## 2022-06-18 LAB — CBC
HCT: 34.1 % — ABNORMAL LOW (ref 36.0–46.0)
Hemoglobin: 11.1 g/dL — ABNORMAL LOW (ref 12.0–15.0)
MCH: 27.6 pg (ref 26.0–34.0)
MCHC: 32.6 g/dL (ref 30.0–36.0)
MCV: 84.8 fL (ref 80.0–100.0)
Platelets: 248 10*3/uL (ref 150–400)
RBC: 4.02 MIL/uL (ref 3.87–5.11)
RDW: 14.1 % (ref 11.5–15.5)
WBC: 8.7 10*3/uL (ref 4.0–10.5)
nRBC: 0 % (ref 0.0–0.2)

## 2022-06-18 LAB — COMPREHENSIVE METABOLIC PANEL
ALT: 14 U/L (ref 0–44)
AST: 17 U/L (ref 15–41)
Albumin: 3.2 g/dL — ABNORMAL LOW (ref 3.5–5.0)
Alkaline Phosphatase: 102 U/L (ref 38–126)
Anion gap: 8 (ref 5–15)
BUN: 7 mg/dL (ref 6–20)
CO2: 24 mmol/L (ref 22–32)
Calcium: 8.8 mg/dL — ABNORMAL LOW (ref 8.9–10.3)
Chloride: 108 mmol/L (ref 98–111)
Creatinine, Ser: 0.71 mg/dL (ref 0.44–1.00)
GFR, Estimated: >60 mL/min (ref >60)
Glucose, Bld: 88 mg/dL (ref 70–99)
Potassium: 3.6 mmol/L (ref 3.5–5.1)
Sodium: 140 mmol/L (ref 135–145)
Total Bilirubin: 0.6 mg/dL (ref 0.3–1.2)
Total Protein: 6.6 g/dL (ref 6.5–8.1)

## 2022-06-18 MED ORDER — NIFEDIPINE 10 MG PO CAPS
10.0000 mg | ORAL_CAPSULE | ORAL | Status: DC | PRN
  Administered 2022-06-18: 10 mg via ORAL
  Filled 2022-06-18: qty 1

## 2022-06-18 MED ORDER — NIFEDIPINE ER OSMOTIC RELEASE 30 MG PO TB24: 30.0000 mg | ORAL_TABLET | Freq: Every day | ORAL | 0 refills | Status: DC

## 2022-06-18 MED ORDER — LABETALOL HCL 5 MG/ML IV SOLN: 40.0000 mg | INTRAVENOUS | Status: DC | PRN

## 2022-06-18 MED ORDER — FUROSEMIDE 20 MG PO TABS: 20.0000 mg | ORAL_TABLET | Freq: Every day | ORAL | 0 refills | Status: DC

## 2022-06-18 MED ORDER — NIFEDIPINE 10 MG PO CAPS: 20.0000 mg | ORAL_CAPSULE | ORAL | Status: DC | PRN

## 2022-06-18 NOTE — MAU Note (Signed)
.  Erika Christian is a 26 y.o. postpartum vaginal delivery on 8/8 here in MAU reporting: blurred vision that started yesterday along with some elevated blood pressures, last BP check an hour ago was 163/91. Denies HA or any other pain.   Onset of complaint: yesterday  Pain score: 0 Vitals:   06/18/22 1910  BP: (!) 165/80  Pulse: 64  Resp: 18  Temp: 98.8 F (37.1 C)  SpO2: 99%     FHT: pt is postpartum  Lab orders placed from triage:  none.

## 2022-06-18 NOTE — MAU Provider Note (Signed)
History     [id number]  Arrival date and time: 06/18/22 1850    Chief Complaint  Patient presents with   Hypertension   Blurred Vision     HPI Erika Christian is a 26 y.o. at 5 days post partum who presents for hypertension. History of preeclampsia in previous pregnancy. No BP issues with this pregnancy & denies history of chronic hypertension.  Reports episodes of blurred vision at home. None currently. Denies headache or epigastric pain.   OB History     Gravida  2   Para  2   Term  2   Preterm  0   AB  0   Living  2      SAB  0   IAB  0   Ectopic  0   Multiple  0   Live Births  2           Past Medical History:  Diagnosis Date   Preeclampsia    Shingles     Past Surgical History:  Procedure Laterality Date   APPENDECTOMY      Family History  Problem Relation Age of Onset   Diabetes Paternal Grandmother    Cancer Paternal Grandmother        lung    Allergies  Allergen Reactions   Orange Fruit [Citrus] Hives    No current facility-administered medications on file prior to encounter.   Current Outpatient Medications on File Prior to Encounter  Medication Sig Dispense Refill   ferrous sulfate 325 (65 FE) MG tablet Take 325 mg by mouth daily.     ibuprofen (ADVIL) 600 MG tablet Take 1 tablet (600 mg total) by mouth every 6 (six) hours as needed for moderate pain. 30 tablet 0   Prenatal Vit-Fe Fumarate-FA (PRENATAL MULTIVITAMIN) TABS tablet Take 1 tablet by mouth daily.       ROS Pertinent positives and negative per HPI, all others reviewed and negative  Physical Exam   BP 127/72   Pulse 97   Temp 98.8 F (37.1 C) (Oral)   Resp 17   Ht 4\' 11"  (1.499 m)   Wt 91.3 kg   SpO2 99%   BMI 40.64 kg/m   Patient Vitals for the past 24 hrs:  BP Temp Temp src Pulse Resp SpO2 Height Weight  06/18/22 2025 -- -- -- -- -- 99 % -- --  06/18/22 2016 127/72 -- -- 97 -- -- -- --  06/18/22 2015 -- -- -- -- -- 100 % -- --  06/18/22 2010  -- -- -- -- -- 100 % -- --  06/18/22 2001 (!) 154/78 -- -- 65 -- -- -- --  06/18/22 1950 -- -- -- -- -- 100 % -- --  06/18/22 1946 (!) 164/82 -- -- 68 -- -- -- --  06/18/22 1945 -- -- -- -- -- 100 % -- --  06/18/22 1940 -- -- -- -- -- 99 % -- --  06/18/22 1935 -- -- -- -- -- 99 % -- --  06/18/22 1930 (!) 166/84 -- -- 60 -- 100 % -- --  06/18/22 1927 (!) 156/86 -- -- 72 17 99 % -- --  06/18/22 1920 -- -- -- -- -- 100 % -- --  06/18/22 1910 (!) 165/80 98.8 F (37.1 C) Oral 64 18 99 % 4\' 11"  (1.499 m) 91.3 kg    Physical Exam Vitals and nursing note reviewed.  Constitutional:      General: She is not in acute distress.  Appearance: Normal appearance.  HENT:     Head: Normocephalic and atraumatic.  Cardiovascular:     Rate and Rhythm: Normal rate and regular rhythm.     Heart sounds: Normal heart sounds.  Pulmonary:     Effort: Pulmonary effort is normal. No respiratory distress.     Breath sounds: Normal breath sounds.  Musculoskeletal:     Right lower leg: 1+ Edema present.     Left lower leg: 1+ Edema present.  Neurological:     Mental Status: She is alert.     Deep Tendon Reflexes:     Reflex Scores:      Patellar reflexes are 2+ on the right side and 2+ on the left side.    Comments: No clonus       Labs Results for orders placed or performed during the hospital encounter of 06/18/22 (from the past 24 hour(s))  CBC     Status: Abnormal   Collection Time: 06/18/22  7:59 PM  Result Value Ref Range   WBC 8.7 4.0 - 10.5 K/uL   RBC 4.02 3.87 - 5.11 MIL/uL   Hemoglobin 11.1 (L) 12.0 - 15.0 g/dL   HCT 36.6 (L) 44.0 - 34.7 %   MCV 84.8 80.0 - 100.0 fL   MCH 27.6 26.0 - 34.0 pg   MCHC 32.6 30.0 - 36.0 g/dL   RDW 42.5 95.6 - 38.7 %   Platelets 248 150 - 400 K/uL   nRBC 0.0 0.0 - 0.2 %  Comprehensive metabolic panel     Status: Abnormal   Collection Time: 06/18/22  7:59 PM  Result Value Ref Range   Sodium 140 135 - 145 mmol/L   Potassium 3.6 3.5 - 5.1 mmol/L    Chloride 108 98 - 111 mmol/L   CO2 24 22 - 32 mmol/L   Glucose, Bld 88 70 - 99 mg/dL   BUN 7 6 - 20 mg/dL   Creatinine, Ser 5.64 0.44 - 1.00 mg/dL   Calcium 8.8 (L) 8.9 - 10.3 mg/dL   Total Protein 6.6 6.5 - 8.1 g/dL   Albumin 3.2 (L) 3.5 - 5.0 g/dL   AST 17 15 - 41 U/L   ALT 14 0 - 44 U/L   Alkaline Phosphatase 102 38 - 126 U/L   Total Bilirubin 0.6 0.3 - 1.2 mg/dL   GFR, Estimated >33 >29 mL/min   Anion gap 8 5 - 15    Imaging No results found.  MAU Course  Procedures Lab Orders         CBC         Comprehensive metabolic panel     Meds ordered this encounter  Medications   AND Linked Order Group    NIFEdipine (PROCARDIA) capsule 10 mg    NIFEdipine (PROCARDIA) capsule 20 mg    NIFEdipine (PROCARDIA) capsule 20 mg    labetalol (NORMODYNE) injection 40 mg   NIFEdipine (PROCARDIA-XL/NIFEDICAL-XL) 30 MG 24 hr tablet    Sig: Take 1 tablet (30 mg total) by mouth daily.    Dispense:  30 tablet    Refill:  0    Order Specific Question:   Supervising Provider    Answer:   Fabio Bering   furosemide (LASIX) 20 MG tablet    Sig: Take 1 tablet (20 mg total) by mouth daily for 5 days.    Dispense:  5 tablet    Refill:  0    Order Specific Question:   Supervising Provider  AnswerMilas Hock [[id number]]   Imaging Orders  No imaging studies ordered today    MDM Postpartum patient with elevated BPs. Severe range x 2, given oral procardia protocol with good response. Asymptomatic. Preeclampsia labs normal. Will d/c home with procardia & lasix x 5 days per discussion with Dr. Para March Assessment and Plan   1. Postpartum hypertension   -Rx procardia xl 30 daily -Rx lasix 20 daily x 5 days -Message to [facility] for 1 week f/u BP appointment -Reviewed preeclampsia precautions   Judeth Horn, NP 06/18/22 8:40 PM

## 2022-06-19 ENCOUNTER — Encounter (HOSPITAL_BASED_OUTPATIENT_CLINIC_OR_DEPARTMENT_OTHER): Payer: Medicaid Other | Admitting: Obstetrics & Gynecology

## 2022-06-19 ENCOUNTER — Inpatient Hospital Stay (HOSPITAL_COMMUNITY)
Admission: AD | Admit: 2022-06-19 | Discharge: 2022-06-19 | Disposition: A | Discharge status: Home / Self Care | Payer: Medicaid Other | Attending: Obstetrics & Gynecology | Admitting: Obstetrics & Gynecology

## 2022-06-19 ENCOUNTER — Encounter (HOSPITAL_COMMUNITY): Payer: Self-pay | Admitting: Obstetrics & Gynecology

## 2022-06-19 ENCOUNTER — Telehealth (HOSPITAL_BASED_OUTPATIENT_CLINIC_OR_DEPARTMENT_OTHER): Payer: Self-pay | Admitting: *Deleted

## 2022-06-19 DIAGNOSIS — O165 Unspecified maternal hypertension, complicating the puerperium: Secondary | ICD-10-CM | POA: Diagnosis not present

## 2022-06-19 DIAGNOSIS — O1495 Unspecified pre-eclampsia, complicating the puerperium: Secondary | ICD-10-CM

## 2022-06-19 LAB — COMPREHENSIVE METABOLIC PANEL
ALT: 15 U/L (ref 0–44)
AST: 16 U/L (ref 15–41)
Albumin: 3.1 g/dL — ABNORMAL LOW (ref 3.5–5.0)
Alkaline Phosphatase: 101 U/L (ref 38–126)
Anion gap: 8 (ref 5–15)
BUN: 7 mg/dL (ref 6–20)
CO2: 23 mmol/L (ref 22–32)
Calcium: 8.8 mg/dL — ABNORMAL LOW (ref 8.9–10.3)
Chloride: 109 mmol/L (ref 98–111)
Creatinine, Ser: 0.63 mg/dL (ref 0.44–1.00)
GFR, Estimated: >60 mL/min (ref >60)
Glucose, Bld: 96 mg/dL (ref 70–99)
Potassium: 3.9 mmol/L (ref 3.5–5.1)
Sodium: 140 mmol/L (ref 135–145)
Total Bilirubin: 0.4 mg/dL (ref 0.3–1.2)
Total Protein: 6.4 g/dL — ABNORMAL LOW (ref 6.5–8.1)

## 2022-06-19 LAB — CBC WITH DIFFERENTIAL/PLATELET
Abs Immature Granulocytes: 0.06 10*3/uL (ref 0.00–0.07)
Basophils Absolute: 0 10*3/uL (ref 0.0–0.1)
Basophils Relative: 0 %
Eosinophils Absolute: 0 10*3/uL (ref 0.0–0.5)
Eosinophils Relative: 1 %
HCT: 35.7 % — ABNORMAL LOW (ref 36.0–46.0)
Hemoglobin: 11.6 g/dL — ABNORMAL LOW (ref 12.0–15.0)
Immature Granulocytes: 1 %
Lymphocytes Relative: 21 %
Lymphs Abs: 1.8 10*3/uL (ref 0.7–4.0)
MCH: 27.7 pg (ref 26.0–34.0)
MCHC: 32.5 g/dL (ref 30.0–36.0)
MCV: 85.2 fL (ref 80.0–100.0)
Monocytes Absolute: 0.5 10*3/uL (ref 0.1–1.0)
Monocytes Relative: 5 %
Neutro Abs: 6.4 10*3/uL (ref 1.7–7.7)
Neutrophils Relative %: 72 %
Platelets: 281 10*3/uL (ref 150–400)
RBC: 4.19 MIL/uL (ref 3.87–5.11)
RDW: 14 % (ref 11.5–15.5)
WBC: 8.8 10*3/uL (ref 4.0–10.5)
nRBC: 0 % (ref 0.0–0.2)

## 2022-06-19 MED ORDER — NIFEDIPINE ER OSMOTIC RELEASE 30 MG PO TB24: 30.0000 mg | ORAL_TABLET | Freq: Two times a day (BID) | ORAL | 0 refills | Status: DC

## 2022-06-19 MED ORDER — ACETAMINOPHEN 500 MG PO TABS
1000.0000 mg | ORAL_TABLET | Freq: Once | ORAL | Status: AC
  Administered 2022-06-19: 1000 mg via ORAL
  Filled 2022-06-19: qty 2

## 2022-06-19 NOTE — MAU Note (Signed)
Erika Christian is a 26 y.o. at Unknown here in MAU reporting: was here yesterday for BP, took meds at 1100. BP 172/102 about before she arrived. BP only elevated after delivery.  +HA (has not taken anything) ,denies  visual changes or epigastric pain.   Onset of complaint: this morning Pain score: 2 Vitals:   06/19/22 1421  BP: (!) 169/88  Pulse: 65  Resp: 18  Temp: 99 F (37.2 C)  SpO2: 100%      Lab orders placed from triage:

## 2022-06-19 NOTE — MAU Provider Note (Signed)
History     CSN: [id number]  Arrival date and time: 06/19/22 1356   Event Date/Time   First Provider Initiated Contact with Patient 06/19/22 1453      Chief Complaint  Patient presents with   Headache   Hypertension   HPI  Ms. Lylianna Fraiser Is 26 y.o. female G2P2002 status post vaginal delivery on 8/8. She has a history of severe Pre E with previous pregnancy, however did not start having BP issues until 5 days PP with this pregnancy. She was seen in MAU yesterday for elevated BP. Upon arrival she had several severe range BP's however responded well to procardia PO. At Dc her BP was grossly normal. Today she reports a very mild HA (2/10), she has not taken anything for the [place]. She reports not eating well today; only an apple. She no vision changes. She started her procardia 30 XL and lasix 20 mg this morning at home. She has taken 1 dose of each. She reports one episode of vomiting this afternoon.   OB History     Gravida  2   Para  2   Term  2   Preterm  0   AB  0   Living  2      SAB  0   IAB  0   Ectopic  0   Multiple  0   Live Births  2           Past Medical History:  Diagnosis Date   Preeclampsia    Shingles     Past Surgical History:  Procedure Laterality Date   APPENDECTOMY      Family History  Problem Relation Age of Onset   Diabetes Paternal Grandmother    Cancer Paternal Grandmother        lung    Social History   Tobacco Use   Smoking status: Never    Passive exposure: Never   Smokeless tobacco: Never  Vaping Use   Vaping Use: Never used  Substance Use Topics   Alcohol use: Not Currently   Drug use: Never    Allergies:  Allergies  Allergen Reactions   Orange Fruit [Citrus] Hives    Medications Prior to Admission  Medication Sig Dispense Refill Last Dose   furosemide (LASIX) 20 MG tablet Take 1 tablet (20 mg total) by mouth daily for 5 days. 5 tablet 0 06/19/2022 at 1100   NIFEdipine (PROCARDIA-XL/NIFEDICAL-XL)  30 MG 24 hr tablet Take 1 tablet (30 mg total) by mouth daily. 30 tablet 0 06/19/2022 at 1100   ferrous sulfate 325 (65 FE) MG tablet Take 325 mg by mouth daily.      ibuprofen (ADVIL) 600 MG tablet Take 1 tablet (600 mg total) by mouth every 6 (six) hours as needed for moderate pain. 30 tablet 0    Prenatal Vit-Fe Fumarate-FA (PRENATAL MULTIVITAMIN) TABS tablet Take 1 tablet by mouth daily.      Results for orders placed or performed during the hospital encounter of 06/19/22 (from the past 48 hour(s))  CBC with Differential/Platelet     Status: Abnormal   Collection Time: 06/19/22  2:44 PM  Result Value Ref Range   WBC 8.8 4.0 - 10.5 K/uL   RBC 4.19 3.87 - 5.11 MIL/uL   Hemoglobin 11.6 (L) 12.0 - 15.0 g/dL   HCT 25.0 (L) 53.9 - 76.7 %   MCV 85.2 80.0 - 100.0 fL   MCH 27.7 26.0 - 34.0 pg   MCHC 32.5 30.0 -  36.0 g/dL   RDW 14.0 11.5 - 15.5 %   Platelets 281 150 - 400 K/uL   nRBC 0.0 0.0 - 0.2 %   Neutrophils Relative % 72 %   Neutro Abs 6.4 1.7 - 7.7 K/uL   Lymphocytes Relative 21 %   Lymphs Abs 1.8 0.7 - 4.0 K/uL   Monocytes Relative 5 %   Monocytes Absolute 0.5 0.1 - 1.0 K/uL   Eosinophils Relative 1 %   Eosinophils Absolute 0.0 0.0 - 0.5 K/uL   Basophils Relative 0 %   Basophils Absolute 0.0 0.0 - 0.1 K/uL   Immature Granulocytes 1 %   Abs Immature Granulocytes 0.06 0.00 - 0.07 K/uL    Comment: Performed at [facility] [address], [place], [place] [address]  Comprehensive metabolic panel     Status: Abnormal   Collection Time: 06/19/22  2:44 PM  Result Value Ref Range   Sodium 140 135 - 145 mmol/L   Potassium 3.9 3.5 - 5.1 mmol/L   Chloride 109 98 - 111 mmol/L   CO2 23 22 - 32 mmol/L   Glucose, Bld 96 70 - 99 mg/dL    Comment: Glucose reference range applies only to samples taken after fasting for at least 8 hours.   BUN 7 6 - 20 mg/dL   Creatinine, Ser 0.63 0.44 - 1.00 mg/dL   Calcium 8.8 (L) 8.9 - 10.3 mg/dL   Total Protein 6.4 (L) 6.5 - 8.1 g/dL   Albumin  3.1 (L) 3.5 - 5.0 g/dL   AST 16 15 - 41 U/L   ALT 15 0 - 44 U/L   Alkaline Phosphatase 101 38 - 126 U/L   Total Bilirubin 0.4 0.3 - 1.2 mg/dL   GFR, Estimated >60 >60 mL/min    Comment: (NOTE) Calculated using the CKD-EPI Creatinine Equation (2021)    Anion gap 8 5 - 15    Comment: Performed at [facility], [place] [address] [place],  [address]    Review of Systems  Eyes:  Negative for photophobia and visual disturbance.  Gastrointestinal:  Negative for abdominal pain.  Neurological:  Positive for headaches. Negative for dizziness.   Physical Exam   Blood pressure (!) 149/69, pulse 62, temperature 99 F (37.2 C), temperature source Oral, resp. rate 18, SpO2 100 %, not currently breastfeeding.  Patient Vitals for the past 24 hrs:  BP Temp Temp src Pulse Resp SpO2  06/19/22 1600 (!) 149/69 -- -- 62 -- --  06/19/22 1545 (!) 146/72 -- -- (!) 56 -- --  06/19/22 1530 (!) 150/68 -- -- (!) 57 -- --  06/19/22 1515 (!) 148/68 -- -- (!) 53 -- --  06/19/22 1500 (!) 147/67 -- -- (!) 57 -- --  06/19/22 1445 (!) 152/81 -- -- 65 -- --  06/19/22 1436 (!) 153/87 -- -- 67 -- 100 %  06/19/22 1421 (!) 169/88 99 F (37.2 C) Oral 65 18 100 %     Physical Exam Constitutional:      General: She is not in acute distress.    Appearance: She is well-developed. She is not ill-appearing or toxic-appearing.  Musculoskeletal:        General: Swelling (Mild bilateral lower extremity swelling) present.  Skin:    General: Skin is warm.  Neurological:     Mental Status: She is alert and oriented to person, place, and time.     Deep Tendon Reflexes: Reflexes normal.  Psychiatric:  Mood and Affect: Mood normal.    MAU Course  Procedures  MDM  CBC, CMP. Tylenol 1 gram given PO. Patient is present today with her partner.  HA 2/10, headache now 1/10; she reports HA is improving.  CBC and CMP stable compared to labs drawn yesterday in MAU Discussed patient with Dr. Charlotta Newton. BP's are  not severe range, HA improved with tylenol in MAU. She is highly reliable to return if symptoms worsen. Labs are stable. She does not meet criteria for Severe Pre E. Will increase procardia to BID. She should take her second dose this evening. She was instructed to eat a meal, and try not to skip meals throughout the day. Increase oral fluid intake. If she returns to MAU for HA, worsening symptoms, she should stay for magnesium as this would be her 3rd visit to MAU.  Close f/u in the office will be arranged for Wednesday.  Patient and partner very agreeable with plan. They were able to verbalize back the plan of care and worrisome worsening symptoms.    Assessment and Plan    A:  1. Postpartum hypertension     P:  Dc home Increase procardia to 30 mg BID Continue lasix BP check on Wednesday at [facility] Return to MAU if symptoms worsen  Isabel Freese, Harolyn Rutherford, NP 06/19/2022 4:38 PM

## 2022-06-19 NOTE — Telephone Encounter (Signed)
Called pt in response to BP reading in babyscripts of 172/102. Pt complains of headache and vomiting twice. Advised that she should go to MAU for evaluation. Pt states that she is headed there now.

## 2022-06-21 ENCOUNTER — Encounter (HOSPITAL_BASED_OUTPATIENT_CLINIC_OR_DEPARTMENT_OTHER): Payer: Self-pay

## 2022-06-21 ENCOUNTER — Ambulatory Visit (HOSPITAL_BASED_OUTPATIENT_CLINIC_OR_DEPARTMENT_OTHER): Payer: Medicaid Other

## 2022-06-26 ENCOUNTER — Encounter (HOSPITAL_BASED_OUTPATIENT_CLINIC_OR_DEPARTMENT_OTHER): Payer: Medicaid Other | Admitting: Obstetrics & Gynecology

## 2022-06-26 ENCOUNTER — Ambulatory Visit (INDEPENDENT_AMBULATORY_CARE_PROVIDER_SITE_OTHER): Payer: Medicaid Other | Admitting: Obstetrics & Gynecology

## 2022-06-26 VITALS — BP 140/90

## 2022-06-26 DIAGNOSIS — I10 Essential (primary) hypertension: Secondary | ICD-10-CM

## 2022-06-26 NOTE — Progress Notes (Unsigned)
Pt came in for BP check.  Delivered 06/13/22.  Has had elevated BP's went to MAU 8/13 &8/14.  On lasix 20mg  8/13-8/18.  Started on Procardia 30mg  BID on 8/14.   Pt has no symptoms of H/a, blurred vision or nausea. Pt states Procardia makes her feel tired.   Dr reviewed BP's and states no change to medication. Will refer to cardiologist for evaluation. Will place referral. KW CMA

## 2022-06-28 ENCOUNTER — Other Ambulatory Visit (HOSPITAL_BASED_OUTPATIENT_CLINIC_OR_DEPARTMENT_OTHER): Payer: Self-pay | Admitting: Obstetrics & Gynecology

## 2022-06-28 ENCOUNTER — Encounter (HOSPITAL_BASED_OUTPATIENT_CLINIC_OR_DEPARTMENT_OTHER): Payer: Self-pay | Admitting: Obstetrics & Gynecology

## 2022-06-28 DIAGNOSIS — O165 Unspecified maternal hypertension, complicating the puerperium: Secondary | ICD-10-CM

## 2022-07-03 ENCOUNTER — Other Ambulatory Visit (HOSPITAL_BASED_OUTPATIENT_CLINIC_OR_DEPARTMENT_OTHER): Payer: Self-pay | Admitting: *Deleted

## 2022-07-03 MED ORDER — NIFEDIPINE ER OSMOTIC RELEASE 30 MG PO TB24
30.0000 mg | ORAL_TABLET | Freq: Two times a day (BID) | ORAL | 0 refills | Status: DC
  Filled 2022-07-06: qty 60, 30d supply, fill #0

## 2022-07-03 NOTE — Progress Notes (Signed)
Pt requesting refill on procardia. Rx sent to pharmacy

## 2022-07-06 ENCOUNTER — Other Ambulatory Visit (HOSPITAL_BASED_OUTPATIENT_CLINIC_OR_DEPARTMENT_OTHER): Payer: Self-pay | Admitting: Obstetrics & Gynecology

## 2022-07-06 ENCOUNTER — Other Ambulatory Visit (HOSPITAL_BASED_OUTPATIENT_CLINIC_OR_DEPARTMENT_OTHER): Payer: Self-pay

## 2022-07-11 ENCOUNTER — Other Ambulatory Visit (HOSPITAL_BASED_OUTPATIENT_CLINIC_OR_DEPARTMENT_OTHER): Payer: Self-pay | Admitting: Obstetrics & Gynecology

## 2022-07-19 NOTE — Progress Notes (Unsigned)
Cardio-Obstetrics Clinic  New Evaluation  Date:  07/21/2022   ID:  Erika Christian, DOB [birth year], MRN [id number]  PCP:  Jerene Bears, MD   [facility] Providers Cardiologist:  None  Electrophysiologist:  None     Referring MD: Jerene Bears, MD   Chief Complaint: post-partum HTN  History of Present Illness:    Erika Christian is a 26 y.o. female [G2P2002] who is being seen today for the evaluation of post partum HTN at the request of Jerene Bears, MD.   Dr. Rondel Baton note from 06/26/22 reviewed. Patient with known history of pre-eclampsia during first pregnancy and therefore was induced during her second pregnancy at 38 weeks. She had a successful vaginal delivery but developed HTN post. She was seen in the MAU on 8/13 and 8/14 with elevated blood pressures as well as blurry vision/headache. She was started on procardia 30mg  BID but pressures remained elevated prompting referral to Cardio OB clinic for further management.  Today, the patient overall feels well. Her blood pressure is much better controlled and is running 120/80s at home. She is, however, having episodes of fatigue and dizziness after taking the nifedipine. Her BP is not low at those times but she just feels unwell. She is hoping to stop the medication today.  Otherwise, she is doing well. No chest pain, SOB, orthopnea or PND. States she can get some heaviness in her chest if she does not take her BP medications but no exertional symptoms.    Prior CV Studies Reviewed: The following studies were reviewed today: No CV studies  Past Medical History:  Diagnosis Date   HTN (hypertension)    Preeclampsia    Shingles     Past Surgical History:  Procedure Laterality Date   APPENDECTOMY        OB History     Gravida  2   Para  2   Term  2   Preterm  0   AB  0   Living  2      SAB  0   IAB  0   Ectopic  0   Multiple  0   Live Births  2               Current  Medications: Current Meds  Medication Sig   amLODipine (NORVASC) 5 MG tablet Take 1 tablet (5 mg total) by mouth daily.   ibuprofen (ADVIL) 600 MG tablet Take 1 tablet (600 mg total) by mouth every 6 (six) hours as needed for moderate pain.   [DISCONTINUED] NIFEdipine (PROCARDIA-XL/NIFEDICAL-XL) 30 MG 24 hr tablet Take 1 tablet (30 mg total) by mouth 2 (two) times daily.     Allergies:   Orange fruit [citrus]   Social History   Socioeconomic History   Marital status: Married    Spouse name:   Number of children: 1   Years of education: Not on file   Highest education level: Associate degree: academic program  Occupational History   Not on file  Tobacco Use   Smoking status: Never    Passive exposure: Never   Smokeless tobacco: Never  Vaping Use   Vaping Use: Never used  Substance and Sexual Activity   Alcohol use: Not Currently   Drug use: Never   Sexual activity: Not Currently    Birth control/protection: None  Other Topics Concern   Not on file  Social History Narrative   Not on file   Social Determinants of Health  Financial Resource Strain: Low Risk  (12/08/2021)   Overall Financial Resource Strain (CARDIA)    Difficulty of Paying Living Expenses: Not hard at all  Food Insecurity: No Food Insecurity (12/08/2021)   Hunger Vital Sign    Worried About Running Out of Food in the Last Year: Never true    Ran Out of Food in the Last Year: Never true  Transportation Needs: No Transportation Needs (12/08/2021)   PRAPARE - Administrator, Civil Service (Medical): No    Lack of Transportation (Non-Medical): No  Physical Activity: Insufficiently Active (12/08/2021)   Exercise Vital Sign    Days of Exercise per Week: 2 days    Minutes of Exercise per Session: 30 min  Stress: No Stress Concern Present (12/08/2021)   [employer] of Occupational Health - Occupational Stress Questionnaire    Feeling of Stress : Not at all  Social Connections: Moderately  Integrated (12/08/2021)   Social Connection and Isolation Panel [NHANES]    Frequency of Communication with Friends and Family: Twice a week    Frequency of Social Gatherings with Friends and Family: Once a week    Attends Religious Services: More than 4 times per year    Active Member of [employer] or Organizations: No    Attends Banker Meetings: Never    Marital Status: Married      Family History  Problem Relation Age of Onset   Diabetes Paternal Grandmother    Cancer Paternal Grandmother        lung      ROS:   Please see the history of present illness.    All other systems reviewed and are negative.   Labs/EKG Reviewed:    EKG:   EKG is  ordered today.  The ekg ordered today demonstrates NSR with sinus arrhythmia with HR 68  Recent Labs: 06/19/2022: ALT 15; BUN 7; Creatinine, Ser 0.63; Hemoglobin 11.6; Platelets 281; Potassium 3.9; Sodium 140   Recent Lipid Panel No results found for: "CHOL", "TRIG", "HDL", "CHOLHDL", "LDLCALC", "LDLDIRECT"  Physical Exam:    VS:  BP 120/82   Pulse 68   Ht 4\' 11"  (1.499 m)   Wt 187 lb 14.4 oz (85.2 kg)   SpO2 99%   BMI 37.95 kg/m     Wt Readings from Last 3 Encounters:  07/21/22 187 lb 14.4 oz (85.2 kg)  06/21/22 193 lb 12.8 oz (87.9 kg)  06/18/22 201 lb 3.2 oz (91.3 kg)     GEN:  Well nourished, well developed in no acute distress HEENT: Normal NECK: No JVD; No carotid bruits CARDIAC: RRR, no murmurs, rubs, gallops RESPIRATORY:  Clear to auscultation without rales, wheezing or rhonchi  ABDOMEN: Soft, non-tender, non-distended MUSCULOSKELETAL:  No edema; No deformity  SKIN: Warm and dry NEUROLOGIC:  Alert and oriented x 3 PSYCHIATRIC:  Normal affect    Risk Assessment/Risk Calculators:     ASSESSMENT & PLAN:    #Post-Partum HTN: #History of Pre-eclampsia: Patient with history of pre-eclampsia during first pregnancy and now with post-partum HTN after recent delivery of her second child. Currently is on  nifedipine 30mg  BID with BP running 120/80s, but having episodes of fatigue and dizziness following medication administration. Will change nifedipine to amlodipine 5mg  qHS and see if this helps. Will see back in 3 months to see if we can wean meds down/off. -Change nifedipine to amlodipine 5mg  daily to take at night -Will see back in 3 months to see if we can wean down/off  Patient Instructions  Medication Instructions:   STOP NIFEDIPINE NOW  START AMLODIPINE 5 MG BY MOUTH DAILY  *If you need a refill on your cardiac medications before your next appointment, please call your pharmacy*    Follow-Up:  3 MONTHS WITH DR. Shari Prows HERE AT [facility]   Important Information About Sugar         Dispo:  No follow-ups on file.   Medication Adjustments/Labs and Tests Ordered: Current medicines are reviewed at length with the patient today.  Concerns regarding medicines are outlined above.  Tests Ordered: Orders Placed This Encounter  Procedures   EKG 12-Lead   Medication Changes: Meds ordered this encounter  Medications   amLODipine (NORVASC) 5 MG tablet    Sig: Take 1 tablet (5 mg total) by mouth daily.    Dispense:  90 tablet    Refill:  3

## 2022-07-21 ENCOUNTER — Encounter: Payer: Self-pay | Admitting: Cardiology

## 2022-07-21 ENCOUNTER — Ambulatory Visit (INDEPENDENT_AMBULATORY_CARE_PROVIDER_SITE_OTHER): Payer: Medicaid Other | Admitting: Cardiology

## 2022-07-21 VITALS — BP 120/82 | HR 68 | Ht 59.0 in | Wt 187.9 lb

## 2022-07-21 DIAGNOSIS — O165 Unspecified maternal hypertension, complicating the puerperium: Secondary | ICD-10-CM

## 2022-07-21 DIAGNOSIS — Z8759 Personal history of other complications of pregnancy, childbirth and the puerperium: Secondary | ICD-10-CM | POA: Diagnosis not present

## 2022-07-21 MED ORDER — AMLODIPINE BESYLATE 5 MG PO TABS: 5.0000 mg | ORAL_TABLET | Freq: Every day | ORAL | 3 refills | Status: DC

## 2022-07-21 NOTE — Patient Instructions (Signed)
Medication Instructions:   STOP NIFEDIPINE NOW  START AMLODIPINE 5 MG BY MOUTH DAILY  *If you need a refill on your cardiac medications before your next appointment, please call your pharmacy*    Follow-Up:  3 MONTHS WITH DR. Shari Prows HERE AT [facility] CLIN[facility]   Important Information About Sugar

## 2022-07-23 ENCOUNTER — Encounter: Payer: Self-pay | Admitting: Cardiology

## 2022-07-23 DIAGNOSIS — Z8759 Personal history of other complications of pregnancy, childbirth and the puerperium: Secondary | ICD-10-CM

## 2022-07-23 DIAGNOSIS — O165 Unspecified maternal hypertension, complicating the puerperium: Secondary | ICD-10-CM

## 2022-07-23 DIAGNOSIS — Z79899 Other long term (current) drug therapy: Secondary | ICD-10-CM

## 2022-07-25 ENCOUNTER — Encounter (HOSPITAL_BASED_OUTPATIENT_CLINIC_OR_DEPARTMENT_OTHER): Payer: Self-pay | Admitting: Advanced Practice Midwife

## 2022-07-25 ENCOUNTER — Ambulatory Visit (INDEPENDENT_AMBULATORY_CARE_PROVIDER_SITE_OTHER): Payer: Medicaid Other | Admitting: Advanced Practice Midwife

## 2022-07-25 VITALS — BP 135/77 | HR 83 | Ht 59.0 in | Wt 187.8 lb

## 2022-07-25 DIAGNOSIS — Z3009 Encounter for other general counseling and advice on contraception: Secondary | ICD-10-CM

## 2022-07-25 DIAGNOSIS — I10 Essential (primary) hypertension: Secondary | ICD-10-CM

## 2022-07-25 MED ORDER — SLYND 4 MG PO TABS: 1.0000 | ORAL_TABLET | Freq: Every day | ORAL | 11 refills | Status: DC

## 2022-07-25 NOTE — Progress Notes (Signed)
Post Partum Visit Note  Erika Christian is a 26 y.o. G70P2002 female who presents for a postpartum visit. She is 6 weeks postpartum following a normal spontaneous vaginal delivery.  I have fully reviewed the prenatal and intrapartum course. The delivery was at 38 gestational weeks.  Anesthesia: epidural. Postpartum course has been normal. Baby is doing well. Baby is feeding by  formula . Bleeding no bleeding. Bowel function is  complicated by mild constipation . Bladder function is normal. Patient is not sexually active. Contraception method is abstinence. Postpartum depression screening: negative.   The pregnancy intention screening data noted above was reviewed. Potential methods of contraception were discussed. The patient elected to proceed with No data recorded.    Health Maintenance Due  Topic Date Due   COVID-19 Vaccine (3 - Pfizer series) 12/24/2020   INFLUENZA VACCINE  06/06/2022    The following portions of the patient's history were reviewed and updated as appropriate: allergies, current medications, past family history, past medical history, past social history, past surgical history, and problem list.  Review of Systems Pertinent items noted in HPI and remainder of comprehensive ROS otherwise negative.  Objective:  BP 135/77 (BP Location: Left Arm, Patient Position: Sitting, Cuff Size: Large)   Pulse 83   Ht 4\' 11"  (1.499 m) Comment: Reported  Wt 187 lb 12.8 oz (85.2 kg)   BMI 37.93 kg/m    VS reviewed, nursing note reviewed,  Constitutional: well developed, well nourished, no distress HEENT: normocephalic CV: normal rate Pulm/chest wall: normal effort Abdomen: soft Neuro: alert and oriented x 3 Skin: warm, dry Psych: affect normal   Assessment:  1. General counseling and advice on contraceptive management --Discussed pt contraceptive plans and reviewed contraceptive methods based on pt preferences and effectiveness.  Pt prefers OCPs, reviewed increased  risks with HTN, pt BP well controlled currently. --Pt to try POP with Slynd, can switch to low dose OCP if breakthrough bleeding or other side effects - Drospirenone (SLYND) 4 MG TABS; Take 1 tablet by mouth daily at 6 (six) AM.  Dispense: 28 tablet; Refill: 11  2. Hypertension, unspecified type --Continue Norvasc as prescribed by cardiology --F/U as scheduled  3. 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  Mood and well being: Patient with negative depression screening today. Reviewed local resources for support.  - Patient tobacco use? No.   - hx of drug use? No.    2. Infant care and feeding:  -Patient currently breastmilk feeding? No.  -Social determinants of health (SDOH) reviewed in EPIC. No concerns  3. Sexuality, contraception and birth spacing - Patient does not want a pregnancy in the next year.  Desired family size is 2 children.  - Reviewed reproductive life planning. Reviewed contraceptive methods based on pt preferences and effectiveness.  Patient desired Oral Contraceptive today.   - Discussed birth spacing of 18 months  4. Sleep and fatigue -Encouraged family/partner/community support of 4 hrs of uninterrupted sleep to help with mood and fatigue  5. Physical Recovery  - Discussed patients delivery and complications. She describes her labor as good. - Patient had a Vaginal, no problems at delivery. Patient had no laceration. Perineal healing reviewed. Patient expressed understanding - Patient has urinary incontinence? No. - Patient is safe  to resume physical and sexual activity  6.  Health Maintenance - HM due items addressed Yes - Last pap smear  Diagnosis  Date Value Ref Range Status  12/08/2021      - Negative for intraepithelial lesion or  malignancy (NILM)   Pap smear not done at today's visit.  -Breast Cancer screening indicated? No.   7. Chronic Disease/Pregnancy Condition follow up: Hypertension  - PCP/specialist follow up  Fatima Blank, [place] for [place]

## 2022-08-03 ENCOUNTER — Other Ambulatory Visit: Payer: Self-pay | Admitting: Cardiology

## 2022-08-03 MED ORDER — ENALAPRIL MALEATE 5 MG PO TABS: 5.0000 mg | ORAL_TABLET | Freq: Every day | ORAL | 1 refills | Status: DC

## 2022-08-03 NOTE — Telephone Encounter (Signed)
Received: Today Freada Bergeron, MD  Nuala Alpha, LPN Can we change her amlodipine to enalapril 5mg  daily and repeat BMET next week to ensure renal function and electrolytes are stable?    Amlodipine discontinued from the pts med list and new medication enalapril 5 mg po daily sent to her pharmacy on file.  BMET order to check in one week placed in the system.  Pt aware that new medication was sent to her pharmacy and to let me know if 10/5 lab work to recheck bmet is ok for her to come and have done.  This was all sent to her via mychart message.

## 2022-08-03 NOTE — Telephone Encounter (Signed)
Pt confirmed she can come in for repeat BMET on next Thursday 10/5.  Lab appt made for then.

## 2022-08-07 MED ORDER — NIFEDIPINE ER OSMOTIC RELEASE 30 MG PO TB24: 30.0000 mg | ORAL_TABLET | Freq: Every day | ORAL | 1 refills | Status: DC

## 2022-08-07 NOTE — Telephone Encounter (Signed)
New verbal orders given by Dr. Johney Frame today to discontinue the pts enalapril (pt preference) and cancel 10/5 lab appt with our office, due to not taking this medication.  Verbal orders given by Dr. Johney Frame to send in the old regimen in which pt first started taking, Nifedipine 30 mg po daily.  Rx sent to pts pharmacy on file.  Pt made aware of all of this via mychart message.  She was also advised in that message to continue with ambulatory BP monitoring with BP goals outlined by Dr. Johney Frame in this mychart.    10/5 lab appt with our office cancelled.

## 2022-08-07 NOTE — Addendum Note (Signed)
Addended by: Nuala Alpha on: 08/07/2022 11:50 AM   Modules accepted: Orders

## 2022-08-10 ENCOUNTER — Other Ambulatory Visit: Payer: Medicaid Other

## 2022-09-06 ENCOUNTER — Encounter: Payer: Self-pay | Admitting: Cardiology

## 2022-09-14 ENCOUNTER — Encounter: Payer: Self-pay | Admitting: Cardiology

## 2022-09-26 ENCOUNTER — Emergency Department (HOSPITAL_BASED_OUTPATIENT_CLINIC_OR_DEPARTMENT_OTHER)
Admission: EM | Admit: 2022-09-26 | Discharge: 2022-09-26 | Disposition: A | Discharge status: Home / Self Care | Payer: Medicaid Other | Attending: Emergency Medicine | Admitting: Emergency Medicine

## 2022-09-26 ENCOUNTER — Emergency Department (HOSPITAL_BASED_OUTPATIENT_CLINIC_OR_DEPARTMENT_OTHER): Payer: Medicaid Other | Admitting: Radiology

## 2022-09-26 ENCOUNTER — Encounter (HOSPITAL_BASED_OUTPATIENT_CLINIC_OR_DEPARTMENT_OTHER): Payer: Self-pay

## 2022-09-26 ENCOUNTER — Encounter: Payer: Self-pay | Admitting: Cardiology

## 2022-09-26 ENCOUNTER — Other Ambulatory Visit: Payer: Self-pay

## 2022-09-26 DIAGNOSIS — N9489 Other specified conditions associated with female genital organs and menstrual cycle: Secondary | ICD-10-CM | POA: Diagnosis not present

## 2022-09-26 DIAGNOSIS — R0789 Other chest pain: Secondary | ICD-10-CM | POA: Diagnosis not present

## 2022-09-26 DIAGNOSIS — R519 Headache, unspecified: Secondary | ICD-10-CM | POA: Insufficient documentation

## 2022-09-26 DIAGNOSIS — R079 Chest pain, unspecified: Secondary | ICD-10-CM

## 2022-09-26 LAB — BASIC METABOLIC PANEL
Anion gap: 9 (ref 5–15)
BUN: 9 mg/dL (ref 6–20)
CO2: 24 mmol/L (ref 22–32)
Calcium: 9.2 mg/dL (ref 8.9–10.3)
Chloride: 107 mmol/L (ref 98–111)
Creatinine, Ser: 0.66 mg/dL (ref 0.44–1.00)
GFR, Estimated: >60 mL/min (ref >60)
Glucose, Bld: 89 mg/dL (ref 70–99)
Potassium: 4 mmol/L (ref 3.5–5.1)
Sodium: 140 mmol/L (ref 135–145)

## 2022-09-26 LAB — CBC
HCT: 36.6 % (ref 36.0–46.0)
Hemoglobin: 12.1 g/dL (ref 12.0–15.0)
MCH: 28.1 pg (ref 26.0–34.0)
MCHC: 33.1 g/dL (ref 30.0–36.0)
MCV: 85.1 fL (ref 80.0–100.0)
Platelets: 283 10*3/uL (ref 150–400)
RBC: 4.3 MIL/uL (ref 3.87–5.11)
RDW: 14.5 % (ref 11.5–15.5)
WBC: 6.5 10*3/uL (ref 4.0–10.5)
nRBC: 0 % (ref 0.0–0.2)

## 2022-09-26 LAB — HCG, QUANTITATIVE, PREGNANCY: hCG, Beta Chain, Quant, S: <1 m[IU]/mL (ref <5)

## 2022-09-26 LAB — TROPONIN I (HIGH SENSITIVITY): Troponin I (High Sensitivity): <2 ng/L (ref <18)

## 2022-09-26 MED ORDER — SODIUM CHLORIDE 0.9 % IV BOLUS
1000.0000 mL | Freq: Once | INTRAVENOUS | Status: AC
  Administered 2022-09-26: 1000 mL via INTRAVENOUS

## 2022-09-26 MED ORDER — DEXAMETHASONE SODIUM PHOSPHATE 10 MG/ML IJ SOLN
8.0000 mg | Freq: Once | INTRAMUSCULAR | Status: AC
Start: 2022-09-26 — End: 2022-09-26
  Administered 2022-09-26: 8 mg via INTRAVENOUS
  Filled 2022-09-26: qty 1

## 2022-09-26 NOTE — Discharge Instructions (Signed)
Please talk to your OB/GYN/PCP about your antihypertensive medication.  Additionally if you start having numbness, tingling, loss of speech, difficulty with walking please return to the ER.  Your cardiac work-up was unremarkable today, but if you start having severe chest pain or shortness of breath please return to the ER.

## 2022-09-26 NOTE — ED Triage Notes (Signed)
Patient here POV from Home.  Endorses Headaches and CP Intermittently for approximately 3 Months.   States she believes it may be due to her Hypertension Medication Nifedipine.   NAD Noted during Triage. A&Ox4. GCS 15. Ambulatory.

## 2022-09-26 NOTE — ED Provider Notes (Signed)
[facility] EMERGENCY DEPT Provider Note   CSN: [id number] Arrival date & time: 09/26/22  1042     History  Chief Complaint  Patient presents with   Chest Pain    Erika Christian is a 26 y.o. female, history of postpartum hypertension, who presents to the ED secondary to intermittent chest pain that is described as pressurized in the middle of her chest, with no radiation, and intermittent.  She states that it may believe to be related to her nifedipine, because every time she takes it she starts having some chest discomfort.  She denies any nausea, vomiting diarrhea.  Has been taking her Procardia as prescribed.  Also complains of intermittent headaches for the last 2 months, typically are just frontal in nature, and mildly tender, but yesterday she had a headache that was sharp in nature, localized to left side of her posterior scalp, and stabbing, she describes it as having associated nausea, no vision changes however.     Home Medications Prior to Admission medications   Medication Sig Start Date End Date Taking? Authorizing Provider  Drospirenone (SLYND) 4 MG TABS Take 1 tablet by mouth daily at 6 (six) AM. 07/25/22   Leftwich-Kirby, Wilmer Floor, CNM  ibuprofen (ADVIL) 600 MG tablet Take 1 tablet (600 mg total) by mouth every 6 (six) hours as needed for moderate pain. Patient not taking: Reported on 07/25/2022 06/15/22   Ndulue, Chiagoziem J, MD  NIFEdipine (PROCARDIA-XL/NIFEDICAL-XL) 30 MG 24 hr tablet Take 1 tablet (30 mg total) by mouth daily. 08/07/22   Meriam Sprague, MD      Allergies    Orange fruit [citrus]    Review of Systems   Review of Systems  Cardiovascular:  Positive for chest pain.  Neurological:  Positive for headaches. Negative for weakness.    Physical Exam Updated Vital Signs BP 127/86   Pulse 67   Temp 98.6 F (37 C) (Temporal)   Resp 16   Ht 4\' 11"  (1.499 m)   Wt 85.2 kg   SpO2 100%   BMI 37.94 kg/m  Physical Exam Vitals and  nursing note reviewed.  Constitutional:      General: She is not in acute distress.    Appearance: She is well-developed.  HENT:     Head: Normocephalic and atraumatic.  Eyes:     Conjunctiva/sclera: Conjunctivae normal.  Cardiovascular:     Rate and Rhythm: Normal rate and regular rhythm.     Heart sounds: No murmur heard. Pulmonary:     Effort: Pulmonary effort is normal. No respiratory distress.     Breath sounds: Normal breath sounds.  Abdominal:     Palpations: Abdomen is soft.     Tenderness: There is no abdominal tenderness.  Musculoskeletal:        General: No swelling.     Cervical back: Neck supple.  Skin:    General: Skin is warm and dry.     Capillary Refill: Capillary refill takes less than 2 seconds.  Neurological:     General: No focal deficit present.     Mental Status: She is alert and oriented to person, place, and time.  Psychiatric:        Mood and Affect: Mood normal.     ED Results / Procedures / Treatments   Labs (all labs ordered are listed, but only abnormal results are displayed) Labs Reviewed  BASIC METABOLIC PANEL  CBC  HCG, QUANTITATIVE, PREGNANCY  TROPONIN I (HIGH SENSITIVITY)    EKG EKG  Interpretation  Date/Time:  Tuesday September 26 2022 11:00:39 EST Ventricular Rate:  79 PR Interval:  166 QRS Duration: 74 QT Interval:  374 QTC Calculation: 428 R Axis:   52 Text Interpretation: Normal sinus rhythm with sinus arrhythmia nonspecific T wave flattening Confirmed by Pricilla Loveless [phone]) on 09/26/2022 11:14:53 AM  Radiology DG Chest 2 View  Result Date: 09/26/2022 CLINICAL DATA:  Chest pain EXAM: CHEST - 2 VIEW COMPARISON:  02/17/17 CXR FINDINGS: No pleural effusion. No pneumothorax. Normal cardiac and mediastinal contours. No focal airspace opacity. No displaced rib fracture. Visualized upper abdomen is unremarkable. Vertebral body heights are maintained. IMPRESSION: No focal airspace opacity. Electronically Signed   By: Lorenza Cambridge M.D.   On: 09/26/2022 13:56    Procedures Procedures    Medications Ordered in ED Medications  dexamethasone (DECADRON) injection 8 mg (8 mg Intravenous Given 09/26/22 1323)  sodium chloride 0.9 % bolus 1,000 mL (0 mLs Intravenous Stopped 09/26/22 1448)    ED Course/ Medical Decision Making/ A&P                           Medical Decision Making Patient is a 26 year old female, here for chest pain, that has been intermittent/daily for the last 3 months, and headaches that have been pretty consistent for the last 2 months.  She has no neurodeficits on exam we discussed head CT versus no head CT, she decided no head CT but would like treatment of her headache first.  Additionally will obtain cardiac work-up to rule out chest discomfort.  She has not short of breath, tachycardic, and is well-appearing.  Amount and/or Complexity of Data Reviewed Labs: ordered.    Details: Labs are unremarkable 1 troponin unremarkable. Radiology: ordered.    Details: Chest x-ray unremarkable. Discussion of management or test interpretation with external provider(s): HEART 0.  She is not currently having chest pain, and feels great.  She also states that her headache is resolved after the dexamethasone.  Requesting to discharge.  Discussed follow-up with primary care/OB to discuss her nifedipine.  Additionally return precautions were emphasized.  She is PERC negative.  Risk Prescription drug management.  Final Clinical Impression(s) / ED Diagnoses Final diagnoses:  Chest pain, unspecified type  Frequent headaches    Rx / DC Orders ED Discharge Orders     None         Mylin Hirano, [place]ley [place], PA 09/26/22 1452    Pricilla Loveless, MD 10/02/22 [phone]

## 2022-09-26 NOTE — ED Notes (Signed)
2x RN unable to obtain bloodwork; EDP advised, [place] at bedside

## 2022-09-26 NOTE — ED Notes (Signed)
Quant added to blood work, lab notified

## 2022-10-11 NOTE — Progress Notes (Deleted)
Cardio-Obstetrics Clinic  Follow-up Evaluation  Date:  10/11/2022   ID:  Erika Christian, DOB 05-08-1996, MRN [id number]  PCP:  Jerene Bears, MD   [facility] Providers Cardiologist:  None  Electrophysiologist:  None     Referring MD: Jerene Bears, MD   Chief Complaint: post-partum HTN  History of Present Illness:    Erika Christian is a 27 y.o. female [G2P2002] who presents to follow-up for further management of postpartum HTN.  Patient with known history of pre-eclampsia during first pregnancy and therefore was induced during her second pregnancy at 38 weeks. She had a successful vaginal delivery but developed HTN post. She was seen in the MAU on 8/13 and 8/14 with elevated blood pressures as well as blurry vision/headache. She was started on procardia 30mg  BID but pressures remained elevated prompting referral to Cardio OB clinic for further management.  Was last seen in clinic where her blood pressure was well controlled but she was having dizziness with the nifedipine. We changed her to amlodipine at that time.  She contacted multiple times via MyChart about concerns with her blood pressure medications. We made multiple switches and ultimately she ended back on nifedipine.  Today, ***   Prior CV Studies Reviewed: The following studies were reviewed today: No CV studies  Past Medical History:  Diagnosis Date   HTN (hypertension)    Preeclampsia    Shingles     Past Surgical History:  Procedure Laterality Date   APPENDECTOMY        OB History     Gravida  2   Para  2   Term  2   Preterm  0   AB  0   Living  2      SAB  0   IAB  0   Ectopic  0   Multiple  0   Live Births  2               Current Medications: No outpatient medications have been marked as taking for the 10/13/22 encounter (Appointment) with 14/8/23, MD.     Allergies:   Orange fruit [citrus]   Social History   Socioeconomic History    Marital status: Married    Spouse name: Meriam Sprague   Number of children: 1   Years of education: Not on file   Highest education level: Associate degree: academic program  Occupational History   Not on file  Tobacco Use   Smoking status: Never    Passive exposure: Never   Smokeless tobacco: Never  Vaping Use   Vaping Use: Never used  Substance and Sexual Activity   Alcohol use: Not Currently   Drug use: Never   Sexual activity: Not Currently    Birth control/protection: None  Other Topics Concern   Not on file  Social History Narrative   Not on file   Social Determinants of Health   Financial Resource Strain: Low Risk  (12/08/2021)   Overall Financial Resource Strain (CARDIA)    Difficulty of Paying Living Expenses: Not hard at all  Food Insecurity: No Food Insecurity (12/08/2021)   Hunger Vital Sign    Worried About Running Out of Food in the Last Year: Never true    Ran Out of Food in the Last Year: Never true  Transportation Needs: No Transportation Needs (12/08/2021)   PRAPARE - 02/05/2022 (Medical): No    Lack of Transportation (Non-Medical): No  Physical Activity: Insufficiently Active (12/08/2021)   Exercise Vital Sign    Days of Exercise per Week: 2 days    Minutes of Exercise per Session: 30 min  Stress: No Stress Concern Present (12/08/2021)   [employer] of Occupational Health - Occupational Stress Questionnaire    Feeling of Stress : Not at all  Social Connections: Moderately Integrated (12/08/2021)   Social Connection and Isolation Panel [NHANES]    Frequency of Communication with Friends and Family: Twice a week    Frequency of Social Gatherings with Friends and Family: Once a week    Attends Religious Services: More than 4 times per year    Active Member of [employer] or Organizations: No    Attends Banker Meetings: Never    Marital Status: Married      Family History  Problem Relation Age of Onset   Diabetes  Paternal Grandmother    Cancer Paternal Grandmother        lung      ROS:   Please see the history of present illness.    All other systems reviewed and are negative.   Labs/EKG Reviewed:    EKG:   EKG is not ordered today  Recent Labs: 06/19/2022: ALT 15 09/26/2022: BUN 9; Creatinine, Ser 0.66; Hemoglobin 12.1; Platelets 283; Potassium 4.0; Sodium 140   Recent Lipid Panel No results found for: "CHOL", "TRIG", "HDL", "CHOLHDL", "LDLCALC", "LDLDIRECT"  Physical Exam:    VS:  There were no vitals taken for this visit.    Wt Readings from Last 3 Encounters:  09/26/22 187 lb 13.3 oz (85.2 kg)  07/25/22 187 lb 12.8 oz (85.2 kg)  07/21/22 187 lb 14.4 oz (85.2 kg)     GEN:  Well nourished, well developed in no acute distress HEENT: Normal NECK: No JVD; No carotid bruits CARDIAC: RRR, no murmurs, rubs, gallops RESPIRATORY:  Clear to auscultation without rales, wheezing or rhonchi  ABDOMEN: Soft, non-tender, non-distended MUSCULOSKELETAL:  No edema; No deformity  SKIN: Warm and dry NEUROLOGIC:  Alert and oriented x 3 PSYCHIATRIC:  Normal affect    Risk Assessment/Risk Calculators:     ASSESSMENT & PLAN:    #Post-Partum HTN: #History of Pre-eclampsia: Patient with history of pre-eclampsia during first pregnancy and now with post-partum HTN after recent delivery of her second child. Has had multiple reactions to medications including amlodipine and enalapril. Currently is on nifedipine*** -***  There are no Patient Instructions on file for this visit.   Dispo:  No follow-ups on file.   Medication Adjustments/Labs and Tests Ordered: Current medicines are reviewed at length with the patient today.  Concerns regarding medicines are outlined above.  Tests Ordered: No orders of the defined types were placed in this encounter.  Medication Changes: No orders of the defined types were placed in this encounter.

## 2022-10-12 ENCOUNTER — Encounter (HOSPITAL_BASED_OUTPATIENT_CLINIC_OR_DEPARTMENT_OTHER): Payer: Self-pay | Admitting: Obstetrics & Gynecology

## 2022-10-13 ENCOUNTER — Ambulatory Visit (INDEPENDENT_AMBULATORY_CARE_PROVIDER_SITE_OTHER): Payer: Medicaid Other | Admitting: Cardiology

## 2022-10-13 ENCOUNTER — Encounter: Payer: Self-pay | Admitting: Cardiology

## 2022-10-13 VITALS — BP 125/72 | HR 104 | Ht 59.0 in | Wt 185.0 lb

## 2022-10-13 DIAGNOSIS — Z79899 Other long term (current) drug therapy: Secondary | ICD-10-CM | POA: Diagnosis not present

## 2022-10-13 DIAGNOSIS — O165 Unspecified maternal hypertension, complicating the puerperium: Secondary | ICD-10-CM

## 2022-10-13 DIAGNOSIS — Z8759 Personal history of other complications of pregnancy, childbirth and the puerperium: Secondary | ICD-10-CM | POA: Diagnosis not present

## 2022-10-13 NOTE — Progress Notes (Signed)
Cardio-Obstetrics Clinic  Follow-up Evaluation  Date:  10/13/2022   ID:  Erika Christian, DOB 06/07/1996, MRN [id number]  PCP:  Miller, Mary S, MD   [facility] Providers Cardiologist:  None  Electrophysiologist:  None     Referring MD: Miller, Mary S, MD   Chief Complaint: post-partum HTN  History of Present Illness:    Erika Christian is a 26 y.o. female [G2P2002] who presents to follow-up for further management of postpartum HTN.  Patient with known history of pre-eclampsia during first pregnancy and therefore was induced during her second pregnancy at 38 weeks. She had a successful vaginal delivery but developed HTN post. She was seen in the MAU on 8/13 and 8/14 with elevated blood pressures as well as blurry vision/headache. She was started on procardia 30mg BID but pressures remained elevated prompting referral to Cardio OB clinic for further management.  Was last seen in clinic where her blood pressure was well controlled but she was having dizziness with the nifedipine. We changed her to amlodipine at that time.  She contacted us multiple times via MyChart about concerns with her blood pressure medications. We made multiple switches and ultimately she ended back on nifedipine.  Today, she feels well.  She is taking nifedipine 30 mg daily.  Tolerating it well.  Checks her blood pressure at home and gets readings in 120s/80s-90s.  She still struggling with headaches, that she describes as migraines.  However, she does not want to change her blood pressure medicine at this time due to good BP control and ability to manage her headaches with over-the-counter medications.  Denies dizziness,  lower extremity edema.   Prior CV Studies Reviewed: The following studies were reviewed today: No CV studies  Past Medical History:  Diagnosis Date   HTN (hypertension)    Preeclampsia    Shingles     Past Surgical History:  Procedure Laterality Date   APPENDECTOMY         OB History     Gravida  2   Para  2   Term  2   Preterm  0   AB  0   Living  2      SAB  0   IAB  0   Ectopic  0   Multiple  0   Live Births  2               Current Medications: Current Meds  Medication Sig   Drospirenone (SLYND) 4 MG TABS Take 1 tablet by mouth daily at 6 (six) AM.   ibuprofen (ADVIL) 600 MG tablet Take 1 tablet (600 mg total) by mouth every 6 (six) hours as needed for moderate pain.   NIFEdipine (PROCARDIA-XL/NIFEDICAL-XL) 30 MG 24 hr tablet Take 1 tablet (30 mg total) by mouth daily.     Allergies:   Orange fruit [citrus]   Social History   Socioeconomic History   Marital status: Married    Spouse name: Roan Bowden   Number of children: 1   Years of education: Not on file   Highest education level: Associate degree: academic program  Occupational History   Not on file  Tobacco Use   Smoking status: Never    Passive exposure: Never   Smokeless tobacco: Never  Vaping Use   Vaping Use: Never used  Substance and Sexual Activity   Alcohol use: Not Currently   Drug use: Never   Sexual activity: Not Currently    Birth control/protection: None  Other   Topics Concern   Not on file  Social History Narrative   Not on file   Social Determinants of Health   Financial Resource Strain: Low Risk  (12/08/2021)   Overall Financial Resource Strain (CARDIA)    Difficulty of Paying Living Expenses: Not hard at all  Food Insecurity: No Food Insecurity (12/08/2021)   Hunger Vital Sign    Worried About Running Out of Food in the Last Year: Never true    Ran Out of Food in the Last Year: Never true  Transportation Needs: No Transportation Needs (12/08/2021)   PRAPARE - Transportation    Lack of Transportation (Medical): No    Lack of Transportation (Non-Medical): No  Physical Activity: Insufficiently Active (12/08/2021)   Exercise Vital Sign    Days of Exercise per Week: 2 days    Minutes of Exercise per Session: 30 min  Stress: No  Stress Concern Present (12/08/2021)   [employer] of Occupational Health - Occupational Stress Questionnaire    Feeling of Stress : Not at all  Social Connections: Moderately Integrated (12/08/2021)   Social Connection and Isolation Panel [NHANES]    Frequency of Communication with Friends and Family: Twice a week    Frequency of Social Gatherings with Friends and Family: Once a week    Attends Religious Services: More than 4 times per year    Active Member of Clubs or Organizations: No    Attends Club or Organization Meetings: Never    Marital Status: Married      Family History  Problem Relation Age of Onset   Diabetes Paternal Grandmother    Cancer Paternal Grandmother        lung      ROS:   Please see the history of present illness.    All other systems reviewed and are negative.   Labs/EKG Reviewed:    EKG:   EKG is not ordered today  Recent Labs: 06/19/2022: ALT 15 09/26/2022: BUN 9; Creatinine, Ser 0.66; Hemoglobin 12.1; Platelets 283; Potassium 4.0; Sodium 140   Recent Lipid Panel No results found for: "CHOL", "TRIG", "HDL", "CHOLHDL", "LDLCALC", "LDLDIRECT"  Physical Exam:    VS:  BP 125/72   Pulse (!) 104   Ht 4' 11" (1.499 m)   Wt 185 lb (83.9 kg)   SpO2 100%   BMI 37.37 kg/m     Wt Readings from Last 3 Encounters:  10/13/22 185 lb (83.9 kg)  09/26/22 187 lb 13.3 oz (85.2 kg)  07/25/22 187 lb 12.8 oz (85.2 kg)     GEN:  Well developed, comfortable HEENT: Normal NECK: No JVD; No carotid bruits CARDIAC: RRR, no murmurs, rubs, gallops RESPIRATORY:  Clear to auscultation without rales, wheezing or rhonchi  ABDOMEN: Soft, non-tender, non-distended MUSCULOSKELETAL:  No edema; No deformity  SKIN: Warm and dry NEUROLOGIC:  Alert and oriented x 3 PSYCHIATRIC:  Normal affect    Risk Assessment/Risk Calculators:     ASSESSMENT & PLAN:    #Post-Partum HTN: #History of Pre-eclampsia: Patient with history of pre-eclampsia during first pregnancy  and now with post-partum HTN after delivery of her second child. Has had multiple reactions to medications including amlodipine and enalapril. Currently, she is on nifedipine 30 mg daily and blood pressure is well controlled. Does not wish to stop at this time as HA are manageable. Discussed follow-up in 3 months and possible trial off of nifedipine at that time to see if she falls into chronic hypertension or is able to maintain normotension off of   antihypertensives. -Continue Nifedipine 30 mg daily -Monitor BP at home with log, checking 1-2x per week -Follow-up in 3 months  Patient Instructions  Medication Instructions:   Your physician recommends that you continue on your current medications as directed. Please refer to the Current Medication list given to you today.  *If you need a refill on your cardiac medications before your next appointment, please call your pharmacy*    Follow-Up:  3 MONTHS WITH DR. Rylan Kaufmann [facility]   Important Information About Sugar         Dispo:  No follow-ups on file.   Medication Adjustments/Labs and Tests Ordered: Current medicines are reviewed at length with the patient today.  Concerns regarding medicines are outlined above.  Tests Ordered: No orders of the defined types were placed in this encounter.  Medication Changes: No orders of the defined types were placed in this encounter.   

## 2022-10-13 NOTE — Patient Instructions (Signed)
Medication Instructions:   Your physician recommends that you continue on your current medications as directed. Please refer to the Current Medication list given to you today.  *If you need a refill on your cardiac medications before your next appointment, please call your pharmacy*    Follow-Up:  3 MONTHS WITH DR. PEMBERTON [facility]    Important Information About Sugar       

## 2022-10-14 ENCOUNTER — Encounter: Payer: Self-pay | Admitting: Cardiology

## 2022-11-27 ENCOUNTER — Encounter: Payer: Self-pay | Admitting: Family

## 2022-11-27 ENCOUNTER — Encounter: Payer: Self-pay | Admitting: Cardiology

## 2022-11-27 ENCOUNTER — Ambulatory Visit: Payer: Medicaid Other | Admitting: Family

## 2022-11-27 VITALS — BP 133/82 | HR 92 | Temp 98.2°F | Ht 59.0 in | Wt 189.4 lb

## 2022-11-27 DIAGNOSIS — I10 Essential (primary) hypertension: Secondary | ICD-10-CM | POA: Diagnosis not present

## 2022-11-27 DIAGNOSIS — E669 Obesity, unspecified: Secondary | ICD-10-CM

## 2022-11-27 MED ORDER — LOSARTAN POTASSIUM 25 MG PO TABS: 25.0000 mg | ORAL_TABLET | Freq: Every day | ORAL | 2 refills | Status: DC

## 2022-11-27 NOTE — Assessment & Plan Note (Signed)
chronic pt gave birth last year & states her weight has been slowly coming down planning to re-establish with her trainer for exercise Wt. Loss strategies reviewed including portion control, less carbs including sweets, eating most of calories earlier in day, drinking 64oz water qd, and establishing daily exercise routine.

## 2022-11-27 NOTE — Patient Instructions (Signed)
Welcome to [employer] [facility], It was a pleasure meeting you today!    As discussed, I have sent your new blood pressure medicine, Losartan,  to your pharmacy. Continue checking your blood pressure just once a day for the next week since starting a new medicine and then just as needed if you feel bad. Let me know if you see your numbers not getting below 120s or have consistent readings above 130 (top number) and 90 & above bottom number. Continue working on weight loss, eating a low sodium diet, drinking 64oz water daily, and increase exercise daily as able. See handout for other tips!  Please schedule a 6 month follow up visit today and this can be with a physical & fasting labs.    PLEASE NOTE: If you had any LAB tests please let us know if you have not heard back within a few days. You may see your results on MyChart before we have a chance to review them but we will give you a call once they are reviewed by [place]. If we ordered any REFERRALS today, please let us know if you have not heard from their office within the next week.  Let us know through MyChart if you are needing REFILLS, or have your pharmacy send [place] the request. You can also use MyChart to communicate with me or any office staff.  Please try these tips to maintain a healthy lifestyle: It is important that you exercise regularly at least 30 minutes 5 times a week. Think about what you will eat, plan ahead. Choose whole foods, & think  "clean, green, fresh or frozen" over canned, processed or packaged foods which are more sugary, salty, and fatty. 70 to 75% of food eaten should be fresh vegetables and protein. 2-3  meals daily with healthy snacks between meals, but must be whole fruit, protein or vegetables. Aim to eat over a 10 hour period when you are active, for example, 7am to 5pm, and then STOP after your last meal of the day, drinking only water.  Shorter eating windows, 6-8 hours, are showing  benefits in heart disease and blood sugar regulation. Drink water every day! Shoot for 64 ounces daily = 8 cups, no other drink is as healthy! Fruit juice is best enjoyed in a healthy way, by EATING the fruit.

## 2022-11-27 NOTE — Progress Notes (Signed)
New Patient Office Visit  Subjective:  Patient ID: Erika Christian, female    DOB: 03-13-1996  Age: 27 y.o. MRN: [id number]  CC:  Chief Complaint  Patient presents with   New Patient (Initial Visit)   Hypertension    Pt states she has HTN but the BP medication Nifedipine gives her migraines. Pt stopped taking the medication for a month but did take it last night due to BP -190/90.    HPI Erika Christian presents for establishing care today.  Hypertension: Patient is currently maintained on the following medications for blood pressure: Nifedipine 30mg  qd, but thinks it is causing her HA/migraine - stopped taking for several weeks & had no HA, took last night b/c her BP was 190/90.  Failed meds include: reports trying several different meds, one caused cough, Lisinopril? another caused anxiety. Patient reports good compliance with blood pressure medications. Patient denies chest pain, headaches, shortness of breath or swelling. Reports home readings in 120s/70s, but high last evening reports. Pt also is obese. Last 3 blood pressure readings in our office are as follows: BP Readings from Last 3 Encounters:  11/27/22 133/82  10/13/22 125/72  09/26/22 130/85    Assessment & Plan:   Problem List Items Addressed This Visit       Cardiovascular and Mediastinum   Essential hypertension - Primary    Chronic - dx last year postpartum failed 2 other meds, and current Nifedipine works, but causing migraines seen by cardiology last month & discussed the HA but per note in chart she was going to continue the med anyway d/t helping her BP but today wants to stop, BP checked daily has been wnl (advised pt on new guidelines for lower BP) without Nifedipine for several weeks, then had a high reading last night, but most likely d/t eaing restaurant food w/alcohol switching to Losartan 25mg  qd advised on continued wt loss, low sodium diet, increasing exercise where able (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 Loss strategies reviewed including portion control, less carbs including sweets, eating most of calories earlier in day, drinking 64oz water qd, and establishing daily exercise routine.        Subjective:    Outpatient Medications Prior to Visit  Medication Sig Dispense Refill   ibuprofen (ADVIL) 600 MG tablet Take 1 tablet (600 mg total) by mouth every 6 (six) hours as needed for moderate pain. 30 tablet 0   NIFEdipine (PROCARDIA-XL/NIFEDICAL-XL) 30 MG 24 hr tablet Take 1 tablet (30 mg total) by mouth daily. 90 tablet 1   Drospirenone (SLYND) 4 MG TABS Take 1 tablet by mouth daily at 6 (six) AM. 28 tablet 11   No facility-administered medications prior to visit.   Past Medical History:  Diagnosis Date   BMI 40.0-44.9, adult ([place]) 01/06/2022   HTN (hypertension)    Preeclampsia    Shingles    Past Surgical History:  Procedure Laterality Date   APPENDECTOMY      Objective:   Today's Vitals: BP 133/82 (BP Location: Left Arm, Patient Position: Sitting, Cuff Size: Large)   Pulse 92   Temp  98.2 F (36.8 C) (Temporal)   Ht 4\' 11"  (1.499 m)   Wt 189 lb 6.4 oz (85.9 kg)   LMP 11/22/2022 (Approximate)   SpO2 100%   Breastfeeding No   BMI 38.25 kg/m   Physical Exam Vitals and nursing note reviewed.  Constitutional:      Appearance: Normal appearance. She is obese.  Cardiovascular:     Rate and Rhythm: Normal rate and regular rhythm.  Pulmonary:     Effort: Pulmonary effort is normal.     Breath sounds: Normal breath sounds.  Musculoskeletal:        General: Normal range of motion.  Skin:    General: Skin is warm and dry.  Neurological:      Mental Status: She is alert.  Psychiatric:        Mood and Affect: Mood normal.        Behavior: Behavior normal.     Meds ordered this encounter  Medications   losartan (COZAAR) 25 MG tablet    Sig: Take 1 tablet (25 mg total) by mouth daily.    Dispense:  30 tablet    Refill:  2    Order Specific Question:   Supervising Provider    Answer:   ANDY, CAMILLE L [[id number]]    Jeanie Sewer, NP

## 2022-11-27 NOTE — Assessment & Plan Note (Addendum)
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

## 2023-01-10 NOTE — Progress Notes (Addendum)
Cardio[facility]Obst[facility]s Clinic  Follow-up Evaluation  Date:  01/12/2023   ID:  Erika Christian, DOB 05-03-1996, MRN [id number]  PCP:  Jeanie Sewer, NP   [place] Providers Cardiologist:  None  Electrophysiologist:  None     Referring MD: Megan Salon, MD   Chief Complaint: post-partum HTN  History of Present Illness:    Erika Christian is a 27 y.o. female [G2P2002] who presents to follow-up for further management of postpartum HTN.  Patient with known history of pre-eclampsia during first pregnancy and therefore was induced during her second pregnancy at 38 weeks. She had a successful vaginal delivery but developed HTN post. She was seen in the [facility] on 8/13 and 8/14 with elevated blood pressures as well as blurry vision/headache. She was started on procardia 30mg  BID but pressures remained elevated prompting referral to Cardio OB clinic for further management.  After our visit, we made multiple medication changes but ultimately she went back on nifedipine. She continued to have migraines but she wished to stay on the medication. She has since seen her PCP and was changed to losartan.  Today, the patient states that she overall feels well. Has been monitoring her blood pressure and is 110-120s/70s not on medication. Had one day following her birthday where she drank alcohol and her blood pressure was elevated to 190s. She took a single dose of the nifedipine and her BP improved. Since that time, she has not required medication. She has not started the losartan because she is not sure she needs it. Has also been working on lifestyle modifications to keep BP controlled.    Prior CV Studies Reviewed: The following studies were reviewed today: No CV studies  Past Medical History:  Diagnosis Date   BMI 40.0-44.9, adult ([place]) 01/06/2022   HTN (hypertension)    Preeclampsia    Shingles     Past Surgical History:  Procedure Laterality Date   APPENDECTOMY        OB  History     Gravida  2   Para  2   Term  2   Preterm  0   AB  0   Living  2      SAB  0   IAB  0   Ectopic  0   Multiple  0   Live Births  2               Current Medications: Current Meds  Medication Sig   ibuprofen (ADVIL) 600 MG tablet Take 1 tablet (600 mg total) by mouth every 6 (six) hours as needed for moderate pain.   losartan (COZAAR) 25 MG tablet Take 1 tablet (25 mg total) by mouth daily.     Allergies:   Orange fruit [citrus]   Social History   Socioeconomic History   Marital status: Married    Spouse name: Erika Christian   Number of children: 1   Years of education: Not on file   Highest education level: Associate degree: academic program  Occupational History   Not on file  Tobacco Use   Smoking status: Never    Passive exposure: Never   Smokeless tobacco: Never  Vaping Use   Vaping Use: Never used  Substance and Sexual Activity   Alcohol use: Not Currently   Drug use: Never   Sexual activity: Not Currently    Birth control/protection: None  Other Topics Concern   Not on file  Social History Narrative   Not on file  Social Determinants of Health   Financial Resource Strain: Low Risk  (12/08/2021)   Overall Financial Resource Strain (CARDIA)    Difficulty of Paying Living Expenses: Not hard at all  Food Insecurity: No Food Insecurity (12/08/2021)   Hunger Vital Sign    Worried About Running Out of Food in the Last Year: Never true    Ran Out of Food in the Last Year: Never true  Transportation Needs: No Transportation Needs (12/08/2021)   PRAPARE - Hydrologist (Medical): No    Lack of Transportation (Non-Medical): No  Physical Activity: Insufficiently Active (12/08/2021)   Exercise Vital Sign    Days of Exercise per Week: 2 days    Minutes of Exercise per Session: 30 min  Stress: No Stress Concern Present (12/08/2021)   [place]     Feeling of Stress : Not at all  Social Connections: Moderately Integrated (12/08/2021)   Social Connection and Isolation Panel [NHANES]    Frequency of Communication with Friends and Family: Twice a week    Frequency of Social Gatherings with Friends and Family: Once a week    Attends Religious Services: More than 4 times per year    Active Member of [employer] or Organizations: No    Attends Music therapist: Never    Marital Status: Married      Family History  Problem Relation Age of Onset   Early death Mother    Hypertension Maternal Grandmother    Diabetes Paternal Grandmother    Cancer Paternal Grandmother        lung      ROS:   Please see the history of present illness.    All other systems reviewed and are negative.   Labs/EKG Reviewed:    EKG:   ECG not ordered today  Recent Labs: 06/19/2022: ALT 15 09/26/2022: BUN 9; Creatinine, Ser 0.66; Hemoglobin 12.1; Platelets 283; Potassium 4.0; Sodium 140   Recent Lipid Panel No results found for: "CHOL", "TRIG", "HDL", "CHOLHDL", "LDLCALC", "LDLDIRECT"  Physical Exam:    VS:  BP 138/86   Pulse 73   Ht 4\' 11"  (1.499 m)   Wt 197 lb 14.4 oz (89.8 kg)   SpO2 97%   BMI 39.97 kg/m     Wt Readings from Last 3 Encounters:  01/12/23 197 lb 14.4 oz (89.8 kg)  11/27/22 189 lb 6.4 oz (85.9 kg)  10/13/22 185 lb (83.9 kg)     GEN:  Comfortable, NAD HEENT: Normal NECK: No JVD; No carotid bruits CARDIAC: RRR, no murmurs RESPIRATORY:  Clear to auscultation without rales, wheezing or rhonchi  ABDOMEN: Soft, non-tender, non-distended MUSCULOSKELETAL:  No edema; No deformity  SKIN: Warm and dry NEUROLOGIC:  Alert and oriented x 3 PSYCHIATRIC:  Normal affect    Risk Assessment/Risk Calculators:     ASSESSMENT & PLAN:    #Post-Partum HTN: #History of Pre-eclampsia: Patient with history of pre-eclampsia during first pregnancy and post-partum HTN after delivery of her second child. Has had multiple reactions to  medications including amlodipine, nifedipine and enalapril. Currently, she is off medication and her BP is well controlled. Has not yet started on losartan and based on BP readings at home, likely does not need it. She will continue to monitor at home and start the losartan if average BP >130/90. Discussed she cannot get pregnant while on losartan and if she were to become pregnant, she will need to stop the med. -  Has losartan in case BP elevated but her BP is well controlled off of it -Discussed her goal is <130/90 -Reviewed that losartan is not safe in pregnancy and if she were to become pregnant, will need to stop the medication -Will see back in 6 months  There are no Patient Instructions on file for this visit.   Dispo:  No follow-ups on file.   Medication Adjustments/Labs and Tests Ordered: Current medicines are reviewed at length with the patient today.  Concerns regarding medicines are outlined above.  Tests Ordered: No orders of the defined types were placed in this encounter.  Medication Changes: No orders of the defined types were placed in this encounter.

## 2023-01-12 ENCOUNTER — Ambulatory Visit (INDEPENDENT_AMBULATORY_CARE_PROVIDER_SITE_OTHER): Payer: Medicaid Other | Admitting: Cardiology

## 2023-01-12 ENCOUNTER — Encounter: Payer: Self-pay | Admitting: Cardiology

## 2023-01-12 VITALS — BP 138/86 | HR 73 | Ht 59.0 in | Wt 197.9 lb

## 2023-01-12 DIAGNOSIS — Z8759 Personal history of other complications of pregnancy, childbirth and the puerperium: Secondary | ICD-10-CM | POA: Diagnosis not present

## 2023-01-12 DIAGNOSIS — Z79899 Other long term (current) drug therapy: Secondary | ICD-10-CM

## 2023-01-12 DIAGNOSIS — O165 Unspecified maternal hypertension, complicating the puerperium: Secondary | ICD-10-CM

## 2023-01-12 NOTE — Patient Instructions (Signed)
Medication Instructions:   Your physician recommends that you continue on your current medications as directed. Please refer to the Current Medication list given to you today.  *If you need a refill on your cardiac medications before your next appointment, please call your pharmacy*    Follow-Up:  [address] [place]

## 2023-03-01 ENCOUNTER — Emergency Department (HOSPITAL_BASED_OUTPATIENT_CLINIC_OR_DEPARTMENT_OTHER)
Admission: EM | Admit: 2023-03-01 | Discharge: 2023-03-01 | Disposition: A | Discharge status: Home / Self Care | Payer: Medicaid Other | Attending: Emergency Medicine | Admitting: Emergency Medicine

## 2023-03-01 ENCOUNTER — Other Ambulatory Visit: Payer: Self-pay

## 2023-03-01 ENCOUNTER — Encounter (HOSPITAL_BASED_OUTPATIENT_CLINIC_OR_DEPARTMENT_OTHER): Payer: Self-pay | Admitting: Emergency Medicine

## 2023-03-01 DIAGNOSIS — R0789 Other chest pain: Secondary | ICD-10-CM | POA: Diagnosis not present

## 2023-03-01 NOTE — ED Provider Notes (Signed)
[place] EMERGENCY DEPARTMENT AT [facility] Provider Note   CSN: [id number] Arrival date & time: 03/01/23  4098     History  Chief Complaint  Patient presents with   Chest Pain    Erika Christian is a 27 y.o. female.  HPI    HPI Erika Christian is a 27 y.o. female with no significant PMHx who presents to the ED for evaluation of chest discomfort.    Patient reports gradual onset 0630 this morning of central chest and bilateral upper-abdomen "tightness". Tightness improves with laying down and sitting. Worsens with standing from a sitting positing and with deep inspirations. Patient had an episode of chest pain in the past, which was a result of her post-partum high blood pressure. She measured her blood pressure at home when the pain began, she was normotensive. Patient does not have issues with blood pressure anymore - was post-partum in nature. She has a 75 mo old and 27 yr old at home and has to pick them up often - other than this, denies new exercises or weight lifting. No Hx of reflux. No new foods. No known family history of cardiac/pulmonary diseases. Denies illicit drug use. LMP 4/3. She does not believe she is pregnant - uses condoms as birth control.   Denies SOB, cough, headaches, weakness, numbness, N/V, palpitations, syncope, light headedness, abdominal pain, or fevers.   Home Medications Prior to Admission medications   Medication Sig Start Date End Date Taking? Authorizing Provider  ibuprofen (ADVIL) 600 MG tablet Take 1 tablet (600 mg total) by mouth every 6 (six) hours as needed for moderate pain. 06/15/22   Ndulue, Chiagoziem J, MD  losartan (COZAAR) 25 MG tablet Take 1 tablet (25 mg total) by mouth daily. 11/27/22   Dulce Sellar, NP  NIFEdipine (PROCARDIA-XL/NIFEDICAL-XL) 30 MG 24 hr tablet Take 1 tablet (30 mg total) by mouth daily. Patient not taking: Reported on 01/12/2023 08/07/22   Meriam Sprague, MD      Allergies    Orange fruit  [citrus]    Review of Systems   Review of Systems  Physical Exam Updated Vital Signs BP 123/76   Pulse 84   Temp 98.6 F (37 C) (Oral)   Resp 16   Ht 1.422 m ( )   Wt 82.6 kg   LMP 02/11/2023   SpO2 100%   BMI 40.80 kg/m  Physical Exam Vitals and nursing note reviewed.  Constitutional:      General: She is not in acute distress.    Appearance: She is well-developed.  HENT:     Head: Normocephalic and atraumatic.     Right Ear: External ear normal.     Left Ear: External ear normal.     Nose: Nose normal.  Eyes:     Conjunctiva/sclera: Conjunctivae normal.     Pupils: Pupils are equal, round, and reactive to light.  Cardiovascular:     Rate and Rhythm: Normal rate and regular rhythm.     Heart sounds: Normal heart sounds.  Pulmonary:     Effort: Pulmonary effort is normal.     Breath sounds: Normal breath sounds.  Chest:     Chest wall: Tenderness present.  Musculoskeletal:        General: Normal range of motion.     Cervical back: Normal range of motion and neck supple.  Skin:    General: Skin is warm and dry.  Neurological:     Mental Status: She is alert and oriented to person,  place, and time.     Motor: No abnormal muscle tone.     Coordination: Coordination normal.  Psychiatric:        Behavior: Behavior normal.        Thought Content: Thought content normal.     ED Results / Procedures / Treatments   Labs (all labs ordered are listed, but only abnormal results are displayed) Labs Reviewed - No data to display  EKG EKG Interpretation  Date/Time:  Thursday March 01 2023 08:22:42 EDT Ventricular Rate:  90 PR Interval:  166 QRS Duration: 83 QT Interval:  336 QTC Calculation: 412 R Axis:   37 Text Interpretation: Sinus rhythm Normal ECG Confirmed by Margarita Grizzle [phone]) on 03/01/2023 8:49:07 AM  Radiology No results found.  Procedures Procedures    Medications Ordered in ED Medications - No data to display  ED Course/ Medical  Decision Making/ A&P                             Medical Decision Making  Patient seen and evaluated for chest pain.  Differential diagnosis of serious/life threatening causes of chest pain includes ACS, other diseases of the heart such as myocarditis or pericarditis, lung etiologies such as infection or pneumothorax, diseases of the great vessels such as aortic dissection or AAA, pulmonary embolism, or GI sources such as cholecystitis or other upper abdominal causes. Doubt ACS- chest pain reproducible.  EKG normal Doubt myocarditis/pericarditis/tamponade based on history, review of ekg and labs Doubt aortic dissection based on history and review of imaging Doubt intrinsic lung causes such as pneumonia or pneumothorax, based on history, physical exam, and studies obtained. Doubt PE based on history, physical exam, and PERC Doubt acute GI etiology requiring intervention based on history, physical exam and labs. Patient appears stable for discharge. Return precautions and need for follow up discussed and patient voices understanding         Final Clinical Impression(s) / ED Diagnoses Final diagnoses:  Chest wall pain    Rx / DC Orders ED Discharge Orders     None         Margarita Grizzle, MD 03/01/23 [phone]

## 2023-03-01 NOTE — ED Triage Notes (Signed)
Pt arrives pov, steady gait, endorses mid-sternal CP and bilateral "rib pain" upon waking at 0630 today. Pt reports "chest tightness", pain increases with deep inspiration. Pt denies cough

## 2023-03-01 NOTE — ED Notes (Signed)
Printed d/c paperwork.

## 2023-04-28 ENCOUNTER — Encounter: Payer: Self-pay | Admitting: Family

## 2023-04-30 NOTE — Telephone Encounter (Signed)
needs an office visit, thx

## 2023-05-28 ENCOUNTER — Encounter: Payer: Self-pay | Admitting: Family

## 2023-05-28 ENCOUNTER — Ambulatory Visit (INDEPENDENT_AMBULATORY_CARE_PROVIDER_SITE_OTHER): Payer: Medicaid Other | Admitting: Family

## 2023-05-28 ENCOUNTER — Other Ambulatory Visit (HOSPITAL_COMMUNITY): Admission: RE | Admit: 2023-05-28 | Payer: Medicaid Other | Source: Ambulatory Visit

## 2023-05-28 VITALS — BP 123/83 | HR 83 | Temp 97.7°F | Ht <= 58 in | Wt 205.0 lb

## 2023-05-28 DIAGNOSIS — Z0000 Encounter for general adult medical examination without abnormal findings: Secondary | ICD-10-CM | POA: Diagnosis not present

## 2023-05-28 LAB — LIPID PANEL
Cholesterol: 177 mg/dL (ref 0–200)
HDL: 45.7 mg/dL (ref >39.00)
LDL Cholesterol: 116 mg/dL — ABNORMAL HIGH (ref 0–99)
NonHDL: 131.23
Total CHOL/HDL Ratio: 4
Triglycerides: 77 mg/dL (ref 0.0–149.0)
VLDL: 15.4 mg/dL (ref 0.0–40.0)

## 2023-05-28 LAB — COMPREHENSIVE METABOLIC PANEL
ALT: 10 U/L (ref 0–35)
AST: 16 U/L (ref 0–37)
Albumin: 4.5 g/dL (ref 3.5–5.2)
Alkaline Phosphatase: 66 U/L (ref 39–117)
BUN: 13 mg/dL (ref 6–23)
CO2: 22 mEq/L (ref 19–32)
Calcium: 9.2 mg/dL (ref 8.4–10.5)
Chloride: 105 mEq/L (ref 96–112)
Creatinine, Ser: 0.6 mg/dL (ref 0.40–1.20)
GFR: 122.94 mL/min (ref >60.00)
Glucose, Bld: 93 mg/dL (ref 70–99)
Potassium: 4.1 mEq/L (ref 3.5–5.1)
Sodium: 137 mEq/L (ref 135–145)
Total Bilirubin: 0.4 mg/dL (ref 0.2–1.2)
Total Protein: 7.5 g/dL (ref 6.0–8.3)

## 2023-05-28 LAB — CBC WITH DIFFERENTIAL/PLATELET
Basophils Absolute: 0 10*3/uL (ref 0.0–0.1)
Basophils Relative: 0.5 % (ref 0.0–3.0)
Eosinophils Absolute: 0.1 10*3/uL (ref 0.0–0.7)
Eosinophils Relative: 0.9 % (ref 0.0–5.0)
HCT: 37.3 % (ref 36.0–46.0)
Hemoglobin: 12.1 g/dL (ref 12.0–15.0)
Lymphocytes Relative: 36.8 % (ref 12.0–46.0)
Lymphs Abs: 2.9 10*3/uL (ref 0.7–4.0)
MCHC: 32.5 g/dL (ref 30.0–36.0)
MCV: 85.3 fl (ref 78.0–100.0)
Monocytes Absolute: 0.4 10*3/uL (ref 0.1–1.0)
Monocytes Relative: 4.5 % (ref 3.0–12.0)
Neutro Abs: 4.5 10*3/uL (ref 1.4–7.7)
Neutrophils Relative %: 57.3 % (ref 43.0–77.0)
Platelets: 324 10*3/uL (ref 150.0–400.0)
RBC: 4.38 Mil/uL (ref 3.87–5.11)
RDW: 14.6 % (ref 11.5–15.5)
WBC: 7.9 10*3/uL (ref 4.0–10.5)

## 2023-05-28 LAB — TSH: TSH: 0.81 u[IU]/mL (ref 0.35–5.50)

## 2023-05-28 NOTE — Progress Notes (Addendum)
Phone [phone]  Subjective:   Patient is a 27 y.o. female presenting for annual physical.    Chief Complaint  Patient presents with   Annual Exam    Fasting w/labs     See problem oriented charting- ROS- full  review of systems was completed and negative.   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 30 tablet 0   losartan (COZAAR) 25 MG tablet Take 1 tablet (25 mg total) by mouth daily. (Patient not taking: Reported on 05/28/2023) 30 tablet 2   NIFEdipine (PROCARDIA-XL/NIFEDICAL-XL) 30 MG 24 hr tablet Take 1 tablet (30 mg total) by mouth daily. (Patient not taking: Reported on 05/28/2023) 90 tablet 1   No current facility-administered medications for this visit.    Allergies-reviewed and updated Allergies  Allergen Reactions   Orange Fruit [Citrus] Hives    Social History   Social History Narrative   Not on file    Objective:  BP 123/83   Pulse 83   Temp 97.7 F (36.5 C) (Temporal)   Ht 4\' 8"  (1.422 m)   Wt 205 lb (93 kg)   SpO2 100%   BMI 45.96 kg/m  Physical Exam Vitals and nursing note  reviewed. Exam conducted with a chaperone present.  Constitutional:      Appearance: Normal appearance.  HENT:     Head: Normocephalic.     Right Ear: Tympanic membrane normal.     Left Ear: Tympanic membrane normal.     Nose: Nose normal.     Mouth/Throat:     Mouth: Mucous membranes are moist.  Eyes:     Pupils: Pupils are equal, round, and reactive to light.  Cardiovascular:     Rate and Rhythm: Normal rate and regular rhythm.  Pulmonary:     Effort: Pulmonary effort is normal.     Breath sounds: Normal breath sounds.  Genitourinary:    Exam position: Lithotomy position.     Pubic Area: No rash or pubic lice.      Labia:        Right: No rash.        Left: No rash.      Vagina: Normal.     Cervix: Normal.     Comments: PAP smear obtained for testing. Musculoskeletal:        General: Normal range of motion.     Cervical back: Normal range of motion.  Lymphadenopathy:     Cervical: No cervical adenopathy.  Skin:    General: Skin is warm and dry.  Neurological:  Mental Status: She is alert.  Psychiatric:        Mood and Affect: Mood normal.        Behavior: Behavior normal.     Assessment and Plan   Health Maintenance counseling: 1. Anticipatory guidance: Patient counseled regarding regular dental exams q6 months, eye exams,  avoiding smoking and second hand smoke, limiting alcohol to 1 beverage per day, no illicit drugs.   2. Risk factor reduction:  Advised patient of need for regular exercise and diet rich with fruits and vegetables to reduce risk of heart attack and stroke. Wt Readings from Last 3 Encounters:  05/28/23 205 lb (93 kg)  03/01/23 182 lb (82.6 kg)  01/12/23 197 lb 14.4 oz (89.8 kg)   3. Immunizations/screenings/ancillary studies Immunization History  Administered Date(s) Administered   HPV Quadrivalent 05/06/2010, 06/26/2011, 10/17/2012   Influenza-Unspecified 09/06/2021   PFIZER(Purple Top)SARS-COV-2 Vaccination 10/08/2020, 10/29/2020   PPD  Test 04/11/2018   Tdap 04/10/2019, 05/15/2022   There are no preventive care reminders to display for this patient.   4. Cervical cancer screening: PAP smear 5. Skin cancer screening- advised regular sunscreen use. Denies worrisome, changing, or new skin lesions.  6. Birth control/STD check: Condoms/N/A  - discussed different options available & risks/benefits, pt to consider and let me know 7. Smoking associated screening: non- smoker 8. Alcohol screening: none  Annual physical exam -     Comprehensive metabolic panel -     TSH -     Lipid panel -     CBC with Differential/Platelet -     Cytology - PAP   Recommended follow up:  No follow-ups on file. Future Appointments  Date Time Provider Department Center  07/27/2023  1:20 PM Meriam Sprague, MD [facility] None    Lab/Order associations: fasting    Dulce Sellar, NP

## 2023-05-28 NOTE — Patient Instructions (Addendum)
It was very nice to see you today!   I will review your lab results via MyChart in a few days.  See the handout attached about birth control options and let me know what you decide!     PLEASE NOTE:  If you had any lab tests please let us know if you have not heard back within a few days. You may see your results on MyChart before we have a chance to review them but we will give you a call once they are reviewed by [place]. If we ordered any referrals today, please let us know if you have not heard from their office within the next week.

## 2023-05-30 LAB — CYTOLOGY - PAP
Adequacy: ABSENT
Diagnosis: NEGATIVE

## 2023-07-27 ENCOUNTER — Ambulatory Visit: Payer: Medicaid Other | Admitting: Cardiology

## 2023-10-25 ENCOUNTER — Encounter: Payer: Self-pay | Admitting: Family

## 2023-10-25 ENCOUNTER — Ambulatory Visit: Payer: Medicaid Other | Admitting: Family

## 2023-10-25 VITALS — BP 133/87 | HR 105 | Temp 98.0°F | Ht <= 58 in | Wt 214.6 lb

## 2023-10-25 DIAGNOSIS — I10 Essential (primary) hypertension: Secondary | ICD-10-CM

## 2023-10-25 DIAGNOSIS — Z833 Family history of diabetes mellitus: Secondary | ICD-10-CM | POA: Diagnosis not present

## 2023-10-25 DIAGNOSIS — R519 Headache, unspecified: Secondary | ICD-10-CM

## 2023-10-25 LAB — POCT GLYCOSYLATED HEMOGLOBIN (HGB A1C): Hemoglobin A1C: 5.6 % (ref 4.0–5.6)

## 2023-10-25 MED ORDER — METOPROLOL SUCCINATE ER 25 MG PO TB24: 25.0000 mg | ORAL_TABLET | Freq: Every day | ORAL | 1 refills | Status: DC

## 2023-10-25 NOTE — Progress Notes (Signed)
Patient ID: Erika Christian, female    DOB: January 01, 1996, 27 y.o.   MRN: [id number]  Chief Complaint  Patient presents with   Migraine    Pt c/o migraines at temporal area that's last a day, Has tried tylenol which does help sx. Present for 2-3 weeks.    Diabetes    Pt c/o family hx of diabetes       Discussed the use of AI scribe software for clinical note transcription with the patient, who gave verbal consent to proceed.  History of Present Illness   The patient presents with a recent onset of headaches, described as occurring in her temple area, usually one side or the other, every few days and sometimes lasting up to an hour. The headaches are intense enough to interrupt her activities, but most resolve within a few minutes. She occasionally takes Tylenol for relief. The headaches are different from previous ones, which were associated with nifedipine use and have since resolved. The patient also reports a family history of diabetes and is concerned about her own risk.  The patient has a history of hypertension, managed recently with losartan 25mg  qd, but told by her Cardiologist to stop taking as BP controlled and not safe if she becomes pregnant. She has a slightly elevated BP today which she attributes to over-the-counter cough syrup and DayQuil use. She monitors her blood pressure at home occasionally and has been followed by cardiology, but has no upcoming appointments.  The patient also mentions increased stress due to her husband's recent job loss, which has also led to weight gain. She is trying to manage her stress through walking. She reports good hydration and limited caffeine and alcohol intake.     Assessment & Plan:     Headaches - New onset of temporal headaches, different from previous headaches. No associated vision changes, neuropathy, or eye watering. Possible triggers include stress and hypertension. -Start Metoprolol 25mg  at bedtime, beginning with half a pill for  two weeks, then increase to a full pill. -Continue to use Tylenol as needed for headache relief. -Follow up in one month to assess response to Metoprolol.  Hypertension - Chronic, intermittent. Blood pressure slightly elevated at today's visit, patient reports occasional high readings at home. Currently not taking any antihypertensives. -Added Metoprolol 25mg  at bedtime, which may also help with blood pressure control. -Encourage regular home blood pressure monitoring, notifying office of elevated readings. -Encourage stress management techniques such as deep breathing, yoga, and regular exercise. -F/U in 1 month  Risk of Diabetes - Family history of diabetes,  A1C today 5.6, which is at the high end of the normal range. -Encourage low carbohydrate diet, particularly in the evening, eliminate sweets, no sweetened drinks, increase water intake, increase exercise. -Will continue to monitor.   Morbid obesity (HCC) - chronic, Unstable wt up a few more lbs, reports increased stress recently causing her to eat more Continue to advise on wt. Loss strategies including portion control, less carbs including sweets, eating most of calories earlier in day, drinking 64oz water qd, and establishing daily exercise routine.   Subjective:    Outpatient Medications Prior to Visit  Medication Sig Dispense Refill   ibuprofen (ADVIL) 600 MG tablet Take 1 tablet (600 mg total) by mouth every 6 (six) hours as needed for moderate pain. (Patient not taking: Reported on 10/25/2023) 30 tablet 0   losartan (COZAAR) 25 MG tablet Take 1 tablet (25 mg total) by mouth daily. (Patient not taking: Reported on 10/25/2023)  30 tablet 2   NIFEdipine (PROCARDIA-XL/NIFEDICAL-XL) 30 MG 24 hr tablet Take 1 tablet (30 mg total) by mouth daily. (Patient not taking: Reported on 10/25/2023) 90 tablet 1   No facility-administered medications prior to visit.   Past Medical History:  Diagnosis Date   BMI 40.0-44.9, adult (HCC)  01/06/2022   HTN (hypertension)    Preeclampsia    Shingles    Past Surgical History:  Procedure Laterality Date   APPENDECTOMY     Allergies  Allergen Reactions   Orange Fruit [Citrus] Hives      Objective:    Physical Exam Vitals and nursing note reviewed.  Constitutional:      Appearance: Normal appearance. She is morbidly obese.  Cardiovascular:     Rate and Rhythm: Normal rate and regular rhythm.  Pulmonary:     Effort: Pulmonary effort is normal.     Breath sounds: Normal breath sounds.  Musculoskeletal:        General: Normal range of motion.  Skin:    General: Skin is warm and dry.  Neurological:     Mental Status: She is alert.  Psychiatric:        Mood and Affect: Mood normal.        Behavior: Behavior normal.    BP 133/87 (BP Location: Left Arm, Patient Position: Sitting, Cuff Size: Large)   Pulse (!) 105   Temp 98 F (36.7 C) (Temporal)   Ht 4\' 8"  (1.422 m)   Wt 214 lb 9.6 oz (97.3 kg)   LMP 10/18/2023 (Approximate)   SpO2 98%   Breastfeeding No   BMI 48.11 kg/m  Wt Readings from Last 3 Encounters:  10/25/23 214 lb 9.6 oz (97.3 kg)  05/28/23 205 lb (93 kg)  03/01/23 182 lb (82.6 kg)      Dulce Sellar, NP

## 2023-10-25 NOTE — Assessment & Plan Note (Signed)
Blood pressure slightly elevated at today's visit, patient reports occasional high readings at home. Currently not taking any antihypertensives. -Added Metoprolol 25mg  at bedtime, which may also help with blood pressure control. -Encourage regular home blood pressure monitoring, notifying office of elevated readings. -Encourage stress management techniques such as deep breathing, yoga, and regular exercise. -F/U in 1 month

## 2023-10-25 NOTE — Assessment & Plan Note (Signed)
chronic, Unstable wt up a few more lbs, reports increased stress recently causing her to eat more Continue to advise on wt. Loss strategies including portion control, less carbs including sweets, eating most of calories earlier in day, drinking 64oz water qd, and establishing daily exercise routine.

## 2024-01-17 ENCOUNTER — Encounter: Payer: Self-pay | Admitting: Family

## 2024-01-17 NOTE — Telephone Encounter (Signed)
 pt was due for a follow up visit in Jan per my last note, please have pt schedule OV.

## 2024-01-25 ENCOUNTER — Telehealth: Payer: Self-pay

## 2024-01-25 ENCOUNTER — Ambulatory Visit (HOSPITAL_COMMUNITY)
Admission: EM | Admit: 2024-01-25 | Discharge: 2024-01-25 | Disposition: A | Discharge status: Home / Self Care | Attending: Family Medicine | Admitting: Family Medicine

## 2024-01-25 ENCOUNTER — Encounter (HOSPITAL_COMMUNITY): Payer: Self-pay

## 2024-01-25 DIAGNOSIS — I10 Essential (primary) hypertension: Secondary | ICD-10-CM | POA: Diagnosis not present

## 2024-01-25 MED ORDER — METOPROLOL SUCCINATE ER 25 MG PO TB24: 12.5000 mg | ORAL_TABLET | Freq: Every day | ORAL | 0 refills | Status: DC

## 2024-01-25 MED ORDER — NIFEDIPINE ER 30 MG PO TB24: 30.0000 mg | ORAL_TABLET | Freq: Every day | ORAL | 0 refills | Status: DC

## 2024-01-25 MED ORDER — CLONIDINE HCL 0.1 MG PO TABS
0.2000 mg | ORAL_TABLET | Freq: Once | ORAL | Status: AC
  Administered 2024-01-25: 0.2 mg via ORAL

## 2024-01-25 MED ORDER — CLONIDINE HCL 0.1 MG PO TABS
ORAL_TABLET | ORAL | Status: AC
  Filled 2024-01-25: qty 2

## 2024-01-25 NOTE — Discharge Instructions (Addendum)
 1. Primary hypertension (Primary) - EKG 12-Lead shows normal sinus rhythm with a ventricular rate of 83 bpm, no concern for STEMI - cloNIDine (CATAPRES) tablet 0.2 mg given in UC for acutely elevated blood pressure - NIFEdipine (ADALAT CC) 30 MG 24 hr tablet; Take 1 tablet (30 mg total) by mouth daily for high blood pressure.  Dispense: 30 tablet; Refill: 0 - metoprolol succinate (TOPROL-XL) 25 MG 24 hr tablet; Take 0.5 tablets (12.5 mg total) by mouth at bedtime, until follow-up with primary care then follow their recommendations for medication management.  Dispense: 15 tablet; Refill: 0 -Follow-up with primary care provider next week as scheduled for further management of hypertension, diabetes, high cholesterol -If any other symptoms such as chest pain, shortness of breath, weakness, dizziness follow-up in ER immediately for further evaluation and treatment.

## 2024-01-25 NOTE — Telephone Encounter (Signed)
 Copied from CRM [id number]. Topic: Appointments - Appointment Scheduling >> Jan 25, 2024 11:01 AM Aletta Edouard wrote: Patient/patient representative is calling to schedule an appointment. Refer to attachments for appointment information.   Scheduled for 3/25

## 2024-01-25 NOTE — ED Provider Notes (Signed)
 [facility] [place]  Note:  This document was prepared using Dragon voice recognition software and may include unintentional dictation errors.  MRN: [id number] DOB: 08-Dec-1995  Subjective:   Erika Christian is a 28 y.o. female presenting for high blood pressure readings at home yesterday and being out of her blood pressure meds for approximately a week.  Patient reports that she ran out of her metoprolol last week and is supposed to see her primary care provider on Tuesday of next week.  Patient states that she is feeling a little bit fatigued and having chest discomfort over the last 2 days.  Patient reports a 150/88 blood pressure this morning.  Patient reports that she took a allergy medication yesterday which she thinks is elevated her blood pressure.  Patient has not taken any medications for symptoms.  Patient denies any shortness of breath, weakness, dizziness at this time  No current facility-administered medications for this encounter.  Current Outpatient Medications:    ibuprofen (ADVIL) 600 MG tablet, Take 1 tablet (600 mg total) by mouth every 6 (six) hours as needed for moderate pain. (Patient not taking: Reported on 10/25/2023), Disp: 30 tablet, Rfl: 0   losartan (COZAAR) 25 MG tablet, Take 1 tablet (25 mg total) by mouth daily., Disp: 30 tablet, Rfl: 2   metoprolol succinate (TOPROL-XL) 25 MG 24 hr tablet, Take 0.5 tablets (12.5 mg total) by mouth at bedtime. START with 1/2 pill for 2 weeks, then go to 1 full pill., Disp: 15 tablet, Rfl: 0   NIFEdipine (ADALAT CC) 30 MG 24 hr tablet, Take 1 tablet (30 mg total) by mouth daily., Disp: 30 tablet, Rfl: 0   Allergies  Allergen Reactions   Orange Fruit [Citrus] Hives    Past Medical History:  Diagnosis Date   BMI 40.0-44.9, adult (HCC) 01/06/2022   HTN (hypertension)    Preeclampsia    Shingles      Past Surgical History:  Procedure Laterality Date   APPENDECTOMY      Family History  Problem Relation Age of  Onset   Early death Mother    Hypertension Maternal Grandmother    Diabetes Paternal Grandmother    Cancer Paternal Grandmother        lung    Social History   Tobacco Use   Smoking status: Never    Passive exposure: Never   Smokeless tobacco: Never  Vaping Use   Vaping status: Never Used  Substance Use Topics   Alcohol use: Not Currently   Drug use: Never    ROS Refer to HPI for ROS details.  Objective:   Vitals: BP (!) 147/88 (BP Location: Left Arm)   Pulse 90   Temp 97.7 F (36.5 C) (Oral)   Resp 18   Ht 4\' 11"  (1.499 m)   LMP 01/13/2024 (Exact Date)   SpO2 99%   Breastfeeding No   BMI 43.34 kg/m   Physical Exam Vitals and nursing note reviewed.  Constitutional:      General: She is not in acute distress.    Appearance: Normal appearance. She is well-developed. She is not ill-appearing or toxic-appearing.  HENT:     Head: Normocephalic.  Cardiovascular:     Rate and Rhythm: Normal rate and regular rhythm.     Heart sounds: No murmur heard. Pulmonary:     Effort: Pulmonary effort is normal. No respiratory distress.     Breath sounds: Normal breath sounds. No stridor. No wheezing, rhonchi or rales.  Skin:    General:  Skin is warm and dry.  Neurological:     General: No focal deficit present.     Mental Status: She is alert and oriented to person, place, and time.  Psychiatric:        Mood and Affect: Mood normal.        Behavior: Behavior normal.     Procedures  No results found for this or any previous visit (from the past 24 hours).  Assessment and Plan :   PDMP not reviewed this encounter.  1. Primary hypertension   2. Essential hypertension    1. Primary hypertension (Primary) - EKG 12-Lead shows normal sinus rhythm with a ventricular rate of 83 bpm, no concern for STEMI - cloNIDine (CATAPRES) tablet 0.2 mg given in UC for acutely elevated blood pressure - NIFEdipine (ADALAT CC) 30 MG 24 hr tablet; Take 1 tablet (30 mg total) by mouth  daily for high blood pressure.  Dispense: 30 tablet; Refill: 0 - metoprolol succinate (TOPROL-XL) 25 MG 24 hr tablet; Take 0.5 tablets (12.5 mg total) by mouth at bedtime, until follow-up with primary care then follow their recommendations for medication management.  Dispense: 15 tablet; Refill: 0 -Follow-up with primary care provider next week as scheduled for further management of hypertension, diabetes, high cholesterol -If any other symptoms such as chest pain, shortness of breath, weakness, dizziness follow-up in ER immediately for further evaluation and treatment.  [place]   [place], Grant B, [place] 01/25/24 1345

## 2024-01-25 NOTE — ED Triage Notes (Signed)
 Patient presenting with elevated blood pressure onset last night after taking an allergy pill. Was 150/88 this morning and having some chest heaviness.  Has not taken BP meds in a few weeks.  Prescriptions or OTC medications tried: Yes- herbal teas    with no relief.

## 2024-01-28 ENCOUNTER — Encounter: Payer: Self-pay | Admitting: Family

## 2024-01-28 ENCOUNTER — Other Ambulatory Visit: Payer: Self-pay | Admitting: Family

## 2024-01-28 ENCOUNTER — Ambulatory Visit (INDEPENDENT_AMBULATORY_CARE_PROVIDER_SITE_OTHER): Admitting: Family

## 2024-01-28 VITALS — BP 130/72 | HR 78 | Temp 98.3°F | Ht 59.0 in | Wt 209.2 lb

## 2024-01-28 DIAGNOSIS — J301 Allergic rhinitis due to pollen: Secondary | ICD-10-CM | POA: Diagnosis not present

## 2024-01-28 DIAGNOSIS — I10 Essential (primary) hypertension: Secondary | ICD-10-CM

## 2024-01-28 MED ORDER — METOPROLOL SUCCINATE ER 25 MG PO TB24: 12.5000 mg | ORAL_TABLET | Freq: Every day | ORAL | 1 refills | Status: DC

## 2024-01-28 MED ORDER — LOSARTAN POTASSIUM 25 MG PO TABS: 25.0000 mg | ORAL_TABLET | Freq: Every day | ORAL | 1 refills | Status: DC

## 2024-01-28 NOTE — Telephone Encounter (Signed)
 Done filled today by pcp

## 2024-01-28 NOTE — Progress Notes (Signed)
 Patient ID: Erika Christian, female    DOB: 01-17-1996, 28 y.o.   MRN: [id number]  Chief Complaint  Patient presents with   Hypertension    Pt is present for follow up with bp stating having dry mouth really sleeping spells seems to think could be the bp meds. Would like to to talk about allergies due to change of weather stuffy nose.  Discussed the use of AI scribe software for clinical note transcription with the patient, who gave verbal consent to proceed.  History of Present Illness The patient, with a history of hypertension, presents with symptoms of high blood pressure, dry mouth, and drowsiness. She attributes these symptoms to her current medications, Allegra and metoprolol. The patient reports that the Allegra, which she started taking recently, has been causing her to feel dehydrated and drowsy. She also mentions that the metoprolol causes her to have a dry mouth and feel drowsy the next day. The patient has been managing these symptoms by drinking water and Powerade. She also mentions that she has been taking losartan and was given nifedipine by the UC she saw for her high BP but did not start it d/t having issues with this med in the past.   Assessment & Plan Hypertension - Hypertension well-managed with losartan and metoprolol. Dry mouth and drowsiness likely due to metoprolol. Home blood pressure within target range. Prefers current regimen over nifedipine. - Continue losartan 25mg  once daily in the morning. - Go back to metoprolol 1/2 pill = 12.5mg  at bedtime. - Resend prescription for both meds.  - Document Nifedipine as an allergy in her medical record. -F/U in 4 months.  Allergic Rhinitis - Allergic rhinitis with rhinorrhea, sneezing, tearing. Allegra caused increased blood pressure and dry mouth, pt unsure if had decongestant with it. - Switch to plain Allegra or Xyzal. - Consider adding generic nasal sprays like Flonase or Nasacort for congestion if needed. -F/U  prn  Subjective:    Outpatient Medications Prior to Visit  Medication Sig Dispense Refill   metoprolol succinate (TOPROL-XL) 25 MG 24 hr tablet Take 0.5 tablets (12.5 mg total) by mouth at bedtime. START with 1/2 pill for 2 weeks, then go to 1 full pill. 15 tablet 0   ibuprofen (ADVIL) 600 MG tablet Take 1 tablet (600 mg total) by mouth every 6 (six) hours as needed for moderate pain. (Patient not taking: Reported on 01/28/2024) 30 tablet 0   losartan (COZAAR) 25 MG tablet Take 1 tablet (25 mg total) by mouth daily. (Patient not taking: Reported on 01/28/2024) 30 tablet 2   NIFEdipine (ADALAT CC) 30 MG 24 hr tablet Take 1 tablet (30 mg total) by mouth daily. (Patient not taking: Reported on 01/28/2024) 30 tablet 0   No facility-administered medications prior to visit.   Past Medical History:  Diagnosis Date   BMI 40.0-44.9, adult (HCC) 01/06/2022   HTN (hypertension)    Preeclampsia    Shingles    Past Surgical History:  Procedure Laterality Date   APPENDECTOMY     Allergies  Allergen Reactions   Orange Fruit [Citrus] Hives      Objective:    Physical Exam Vitals and nursing note reviewed.  Constitutional:      Appearance: Normal appearance.  Cardiovascular:     Rate and Rhythm: Normal rate and regular rhythm.  Pulmonary:     Effort: Pulmonary effort is normal.     Breath sounds: Normal breath sounds.  Musculoskeletal:        General:  Normal range of motion.  Skin:    General: Skin is warm and dry.  Neurological:     Mental Status: She is alert.  Psychiatric:        Mood and Affect: Mood normal.        Behavior: Behavior normal.    BP 130/72   Pulse 78   Temp 98.3 F (36.8 C) (Temporal)   Ht 4\' 11"  (1.499 m)   Wt 209 lb 3.2 oz (94.9 kg)   LMP 01/13/2024 (Exact Date)   BMI 42.25 kg/m  Wt Readings from Last 3 Encounters:  01/28/24 209 lb 3.2 oz (94.9 kg)  10/25/23 214 lb 9.6 oz (97.3 kg)  05/28/23 205 lb (93 kg)      Dulce Sellar, NP

## 2024-01-28 NOTE — Assessment & Plan Note (Signed)
 Hypertension well-managed with losartan and metoprolol. Dry mouth and drowsiness likely due to metoprolol. Home blood pressure within target range. Prefers current regimen over nifedipine. - Continue losartan 25mg  once daily in the morning. - Go back to metoprolol 1/2 pill = 12.5mg  at bedtime. - Resend prescription for both meds.  - Document Nifedipine as an allergy in her medical record. -F/U in 4 months.

## 2024-01-28 NOTE — Assessment & Plan Note (Signed)
 Allergic rhinitis with rhinorrhea, sneezing, tearing. Allegra caused increased blood pressure and dry mouth. - Switch to plain Allegra or Xyzal. - Consider adding generic nasal sprays like Flonase or Nasacort for congestion if needed. -F/U prn

## 2024-01-29 ENCOUNTER — Ambulatory Visit: Admitting: Family

## 2024-02-13 ENCOUNTER — Telehealth: Payer: Self-pay

## 2024-02-13 NOTE — Telephone Encounter (Signed)
 Copied from CRM [id number]. Topic: Clinical - Medication Question >> Feb 13, 2024 10:24 AM Freda Munro wrote: Reason for CRM: Pt calling with possibly getting Her dosage of metoprolol succinate (TOPROL-XL) 25 MG 24 hr tablet lowered, she is stating that is dropping her blood pressu to 90/60, she is getting shakes and anxiety when she takes it at night.

## 2024-02-13 NOTE — Telephone Encounter (Signed)
 Called patient to come in for office visit to discuss medication adjustment and blood pressure dropping at night. Patient wanting to know should she take the nighttime BP meds tonight or skip dose until seeing PCP. Please advise

## 2024-02-13 NOTE — Telephone Encounter (Signed)
 I called pt, Advised pt to take 1/2 pill of metoprolol at bedtime not in the evening. Pt verbalized understanding.

## 2024-02-13 NOTE — Telephone Encounter (Signed)
 She is supposed to only be on 1/2 metoprolol at bedtime - advised this on her visit 3/25 - is she taking a full pill?

## 2024-02-14 ENCOUNTER — Ambulatory Visit: Admitting: Family

## 2024-02-14 ENCOUNTER — Encounter: Payer: Self-pay | Admitting: Family

## 2024-02-14 VITALS — BP 127/82 | HR 85 | Temp 97.9°F | Ht 59.0 in | Wt 206.2 lb

## 2024-02-14 DIAGNOSIS — I10 Essential (primary) hypertension: Secondary | ICD-10-CM | POA: Diagnosis not present

## 2024-02-14 NOTE — Assessment & Plan Note (Signed)
 Losartan 25 mg BID is effective and well-tolerated for blood pressure control. Metoprolol was discontinued due to adverse effects. - Stop Metoprolol - Increase your losartan 25 mg to BID. Ok to split evening dose in half if BP < 105/60, either number. - Monitor blood pressure regularly and report significant changes. - F/U 3 mos

## 2024-02-14 NOTE — Progress Notes (Signed)
 Patient ID: Erika Christian, female    DOB: 1996/10/18, 28 y.o.   MRN: [id number]  Chief Complaint  Patient presents with   Essential Hypertension    Pt c/o metoprolol causing anxiety, chills  and BP being low. Pt did try taking metoprolol last night at bed time.   Discussed the use of AI scribe software for clinical note transcription with the patient, who gave verbal consent to proceed.  History of Present Illness The patient, with a history of hypertension, presents with concerns about side effects from metoprolol, including shaking and low blood pressure. The patient reports that these symptoms occur after taking the medication and can last for an hour or two. The patient also reports experiencing pinching headaches almost daily, which are not continuous but come and go. The headaches are not associated with any vision changes or watery eyes. The patient has not been taking any medication for the headaches as she is not severe enough to interfere with daily activities. The patient also reports that the headaches seem to worsen when her blood pressure is elevated.  Assessment & Plan Hypertension - Losartan 25 mg BID is effective and well-tolerated for blood pressure control. Metoprolol was discontinued due to adverse effects. She has lost 4lbs. - Stop Metoprolol - Increase your losartan 25 mg to BID. Ok to split evening dose in half if BP < 105/60, either number. - Encourage continued weight loss. - Monitor blood pressure regularly and report significant changes. - F/U 3 mos  Intermittent Headaches - Headaches occur almost daily, likely influenced by hydration, meal timing, and weather changes. No vision changes or severe pain reported. - Ensure adequate hydration. - Maintain regular meal schedule. - Monitor for headache triggers such as weather changes or increased stress. - Schedule an eye exam if has been more than a year. - F/U prn   Subjective:    Outpatient Medications  Prior to Visit  Medication Sig Dispense Refill   losartan (COZAAR) 25 MG tablet Take 1 tablet (25 mg total) by mouth daily. 90 tablet 1   metoprolol succinate (TOPROL-XL) 25 MG 24 hr tablet Take 0.5 tablets (12.5 mg total) by mouth at bedtime. 45 tablet 1   No facility-administered medications prior to visit.   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  Constitutional:      Appearance: Normal appearance. She is obese.  Cardiovascular:     Rate and Rhythm: Normal rate and regular rhythm.  Pulmonary:     Effort: Pulmonary effort is normal.     Breath sounds: Normal breath sounds.  Musculoskeletal:        General: Normal range of motion.  Skin:    General: Skin is warm and dry.  Neurological:     Mental Status: She is alert.  Psychiatric:        Mood and Affect: Mood normal.        Behavior: Behavior normal.    BP 127/82 (BP Location: Left Arm, Patient Position: Sitting, Cuff Size: Large)   Pulse 85   Temp 97.9 F (36.6 C) (  Temporal)   Ht 4\' 11"  (1.499 m)   Wt 206 lb 4 oz (93.6 kg)   LMP 01/13/2024 (Exact Date)   SpO2 98%   Breastfeeding No   BMI 41.66 kg/m  Wt Readings from Last 3 Encounters:  02/14/24 206 lb 4 oz (93.6 kg)  01/28/24 209 lb 3.2 oz (94.9 kg)  10/25/23 214 lb 9.6 oz (97.3 kg)      Dulce Sellar, NP

## 2024-02-15 ENCOUNTER — Encounter: Payer: Self-pay | Admitting: Cardiology

## 2024-02-18 NOTE — Telephone Encounter (Signed)
Noted, pt seen in office.

## 2024-02-18 NOTE — Telephone Encounter (Signed)
 Please see pt msg and advise

## 2024-02-22 ENCOUNTER — Ambulatory Visit (INDEPENDENT_AMBULATORY_CARE_PROVIDER_SITE_OTHER): Admitting: Cardiology

## 2024-02-22 ENCOUNTER — Encounter: Payer: Self-pay | Admitting: Cardiology

## 2024-02-22 ENCOUNTER — Telehealth: Payer: Self-pay | Admitting: Pharmacist

## 2024-02-22 VITALS — BP 119/82 | HR 77 | Ht 59.0 in | Wt 208.2 lb

## 2024-02-22 DIAGNOSIS — R400 Somnolence: Secondary | ICD-10-CM | POA: Diagnosis not present

## 2024-02-22 DIAGNOSIS — R0683 Snoring: Secondary | ICD-10-CM

## 2024-02-22 DIAGNOSIS — I10 Essential (primary) hypertension: Secondary | ICD-10-CM | POA: Diagnosis not present

## 2024-02-22 DIAGNOSIS — G4730 Sleep apnea, unspecified: Secondary | ICD-10-CM

## 2024-02-22 MED ORDER — AMLODIPINE BESYLATE 5 MG PO TABS: 5.0000 mg | ORAL_TABLET | Freq: Every day | ORAL | 3 refills | Status: DC

## 2024-02-22 NOTE — Progress Notes (Unsigned)
 [facility]  {Choose New Eval or Follow Up Note:[phone]}   Prior CV Studies Reviewed: The following studies were reviewed today: ***  Past Medical History:  Diagnosis Date   BMI 40.0-44.9, adult (HCC) 01/06/2022   HTN (hypertension)    Preeclampsia    Recurrent UTI 02/27/2022   Shingles     Past Surgical History:  Procedure Laterality Date   APPENDECTOMY     { Click here to update PMH, PSH, OB Hx then refresh note  :1}   OB History     Gravida  2   Para  2   Term  2   Preterm  0   AB  0   Living  2      SAB  0   IAB  0   Ectopic  0   Multiple  0   Live Births  2           { Click here to update OB Charting then refresh note  :1}    Current Medications: Current Meds  Medication Sig   losartan  (COZAAR ) 25 MG tablet Take 1 tablet (25 mg total) by mouth daily.     Allergies:   Orange fruit [citrus] and Nifedipine    Social History   Socioeconomic History   Marital status: Married    Spouse name: Amiel Balder   Number of children: 1   Years of education: Not on file   Highest education level: Associate degree: academic program  Occupational History   Not on file  Tobacco Use   Smoking status: Never    Passive exposure: Never   Smokeless tobacco: Never  Vaping Use   Vaping status: Never Used  Substance and Sexual Activity   Alcohol use: Not Currently   Drug use: Never   Sexual activity: Not Currently    Birth control/protection: None  Other Topics Concern   Not on file  Social History Narrative   Not on file   Social Drivers of Health   Financial Resource Strain: Low Risk  (12/08/2021)   Overall Financial Resource Strain (CARDIA)    Difficulty of Paying Living Expenses: Not hard at all  Food Insecurity: No Food Insecurity (12/08/2021)   Hunger Vital Sign    Worried About Running Out of Food in the Last Year: Never true    Ran Out of Food in the Last Year: Never true  Transportation Needs: No Transportation  Needs (12/08/2021)   PRAPARE - Administrator, Civil Service (Medical): No    Lack of Transportation (Non-Medical): No  Physical Activity: Insufficiently Active (12/08/2021)   Exercise Vital Sign    Days of Exercise per Week: 2 days    Minutes of Exercise per Session: 30 min  Stress: No Stress Concern Present (12/08/2021)   [employer] of Occupational Health - Occupational Stress Questionnaire    Feeling of Stress : Not at all  Social Connections: Unknown (03/21/2022)   Received from [facility]   Social Network    Social Network: Not on file  { Click here to update SDOH then refresh :1}    Family History  Problem Relation Age of Onset   Early death Mother    Hypertension Maternal Grandmother    Diabetes Paternal Grandmother    Cancer Paternal Grandmother        lung   { Click here to update FH then refresh note    :1}   ROS:   Please see the history of  present illness.    *** All other systems reviewed and are negative.   Labs/EKG Reviewed:    EKG:   EKG is *** ordered today.  The ekg ordered today demonstrates ***  Recent Labs: 05/28/2023: ALT 10; BUN 13; Creatinine, Ser 0.60; Hemoglobin 12.1; Platelets 324.0; Potassium 4.1; Sodium 137; TSH 0.81   Recent Lipid Panel Lab Results  Component Value Date/Time   CHOL 177 05/28/2023 11:00 AM   TRIG 77.0 05/28/2023 11:00 AM   HDL 45.70 05/28/2023 11:00 AM   CHOLHDL 4 05/28/2023 11:00 AM   LDLCALC 116 (H) 05/28/2023 11:00 AM    Physical Exam:    VS:  BP 119/82 (BP Location: Left Arm, Patient Position: Sitting, Cuff Size: Large)   Pulse 77   Ht 4\' 11"  (1.499 m)   Wt 208 lb 3.2 oz (94.4 kg)   LMP 01/13/2024 (Exact Date)   SpO2 100%   BMI 42.05 kg/m     Wt Readings from Last 3 Encounters:  02/22/24 208 lb 3.2 oz (94.4 kg)  02/14/24 206 lb 4 oz (93.6 kg)  01/28/24 209 lb 3.2 oz (94.9 kg)     GEN: *** Well nourished, well developed in no acute distress HEENT: Normal NECK: No JVD;  No carotid bruits LYMPHATICS: No lymphadenopathy CARDIAC: ***RRR, no murmurs, rubs, gallops RESPIRATORY:  Clear to auscultation without rales, wheezing or rhonchi  ABDOMEN: Soft, non-tender, non-distended MUSCULOSKELETAL:  No edema; No deformity  SKIN: Warm and dry NEUROLOGIC:  Alert and oriented x 3 PSYCHIATRIC:  Normal affect    Risk Assessment/Risk Calculators:   { Click to calculate CARPREG II - THEN refresh note :1}    { Click to caclulate Mod WHO Class of CV Risk - THEN refresh note :1}     { Click for CHADS2VASc Score - THEN Refresh Note    :[id number]}      ASSESSMENT & PLAN:    *** There are no Patient Instructions on file for this visit.   Dispo:  No follow-ups on file.   Medication Adjustments/Labs and Tests Ordered: Current medicines are reviewed at length with the patient today.  Concerns regarding medicines are outlined above.  Tests Ordered: No orders of the defined types were placed in this encounter.  Medication Changes: No orders of the defined types were placed in this encounter.

## 2024-02-22 NOTE — Telephone Encounter (Signed)
 In room consult after appt with Dr Emmette Harms. S/e to losartan , starting amlodipien 5mg  once daily today. Discussed medication including possible s/e. F/u in 4 weeks

## 2024-02-22 NOTE — Patient Instructions (Addendum)
 Medication Instructions:  Your physician has recommended you make the following change in your medication:  STOP: Losartan   START: Amlodipine  5 mg once daily  *If you need a refill on your cardiac medications before your next appointment, please call your pharmacy*  Follow-Up: At [facility], you and your health needs are our priority.  As part of our continuing mission to provide you with exceptional heart care, our providers are all part of one team.  This team includes your primary Cardiologist (physician) and Advanced Practice Providers or APPs (Physician Assistants and Nurse Practitioners) who all work together to provide you with the care you need, when you need it.  Your next appointment:   16 week(s) [place]   Provider:   Kardie Tobb, DO    Other Instructions Please take your blood pressure daily for 2 weeks and send in a MyChart message. Please include heart rates. (One message at the end of the 2 weeks).   HOW TO TAKE YOUR BLOOD PRESSURE: Rest 5 minutes before taking your blood pressure. Don't smoke or drink caffeinated beverages for at least 30 minutes before. Take your blood pressure before (not after) you eat. Sit comfortably with your back supported and both feet on the floor (don't cross your legs). Elevate your arm to heart level on a table or a desk. Use the proper sized cuff. It should fit smoothly and snugly around your bare upper arm. There should be enough room to slip a fingertip under the cuff. The bottom edge of the cuff should be 1 inch above the crease of the elbow. Ideally, take 3 measurements at one sitting and record the average.    1st Floor: - Lobby - Registration  - Pharmacy  - Lab - Cafe  2nd Floor: - PV Lab - Diagnostic Testing (echo, CT, nuclear med)  3rd Floor: - Vacant  4th Floor: - TCTS (cardiothoracic surgery) - AFib Clinic - Structural Heart Clinic - Vascular Surgery  - Vascular Ultrasound  5th Floor: - HeartCare  Cardiology (general and EP) - Clinical Pharmacy for coumadin, hypertension, lipid, weight-loss medications, and med management appointments    Valet parking services will be available as well.     Mediterranean Diet A Mediterranean diet is based on the traditions of countries on the [employer]. It focuses on eating more: Fruits and vegetables. Whole grains, beans, nuts, and seeds. Heart-healthy fats. These are fats that are good for your heart. It involves eating less: Dairy. Meat and eggs. Processed foods with added sugar, salt, and fat. This type of diet can help prevent certain conditions. It can also improve outcomes if you have a long-term (chronic) disease, such as kidney or heart disease. What are tips for following this plan? Reading food labels Check packaged foods for: The serving size. For foods such as rice and pasta, the serving size is the amount of cooked product, not dry. The total fat. Avoid foods with saturated fat or trans fat. Added sugars, such as corn syrup. Shopping  Try to have a balanced diet. Buy a variety of foods, such as: Fresh fruits and vegetables. You may be able to get these from local farmers markets. You can also buy them frozen. Grains, beans, nuts, and seeds. Some of these can be bought in bulk. Fresh seafood. Poultry and eggs. Low-fat dairy products. Buy whole ingredients instead of foods that have already been packaged. If you can't get fresh seafood, buy precooked frozen shrimp or canned fish, such as tuna, salmon, or  sardines. Stock your pantry so you always have certain foods on hand, such as olive oil, canned tuna, canned tomatoes, rice, pasta, and beans. Cooking Cook foods with extra-virgin olive oil instead of using butter or other vegetable oils. Have meat as a side dish. Have vegetables or grains as your main dish. This means having meat in small portions or adding small amounts of meat to foods like pasta or stew. Use beans  or vegetables instead of meat in common dishes like chili or lasagna. Try out different cooking methods. Try roasting, broiling, steaming, and sauting vegetables. Add frozen vegetables to soups, stews, pasta, or rice. Add nuts or seeds for added healthy fats and plant protein at each meal. You can add these to yogurt, salads, or vegetable dishes. Marinate fish or vegetables using olive oil, lemon juice, garlic, and fresh herbs. Meal planning Plan to eat a vegetarian meal one day each week. Try to work up to two vegetarian meals, if possible. Eat seafood two or more times a week. Have healthy snacks on hand. These may include: Vegetable sticks with hummus. Greek yogurt. Fruit and nut trail mix. Eat balanced meals. These should include: Fruit: 2-3 servings a day. Vegetables: 4-5 servings a day. Low-fat dairy: 2 servings a day. Fish, poultry, or lean meat: 1 serving a day. Beans and legumes: 2 or more servings a week. Nuts and seeds: 1-2 servings a day. Whole grains: 6-8 servings a day. Extra-virgin olive oil: 3-4 servings a day. Limit red meat and sweets to just a few servings a month. Lifestyle  Try to cook and eat meals with your family. Drink enough fluid to keep your pee (urine) pale yellow. Be active every day. This includes: Aerobic exercise, which is exercise that causes your heart to beat faster. Examples include running and swimming. Leisure activities like gardening, walking, or housework. Get 7-8 hours of sleep each night. Drink red wine if your provider says you can. A glass of wine is 5 oz (150 mL). You may be allowed to have: Up to 1 glass a day if you're female and not pregnant. Up to 2 glasses a day if you're female. What foods should I eat? Fruits Apples. Apricots. Avocado. Berries. Bananas. Cherries. Dates. Figs. Grapes. Lemons. Melon. Oranges. Peaches. Plums. Pomegranate. Vegetables Artichokes. Beets. Broccoli. Cabbage. Carrots. Eggplant. Green beans. Chard. Kale.  Spinach. Onions. Leeks. Peas. Squash. Tomatoes. Peppers. Radishes. Grains Whole-grain pasta. Brown rice. Bulgur wheat. Polenta. Couscous. Whole-wheat bread. Dwyane Glad. Meats and other proteins Beans. Almonds. Sunflower seeds. Pine nuts. Peanuts. Cod. Salmon. Scallops. Shrimp. Tuna. Tilapia. Clams. Oysters. Eggs. Chicken or [place] without skin. Dairy Low-fat milk. Cheese. Greek yogurt. Fats and oils Extra-virgin olive oil. Avocado oil. Grapeseed oil. Beverages Water. Red wine. Herbal tea. Sweets and desserts Greek yogurt with honey. Baked apples. Poached pears. Trail mix. Seasonings and condiments Basil. Cilantro. Coriander. Cumin. Mint. Parsley. Sage. Rosemary. Tarragon. Garlic. Oregano. Thyme. Pepper. Balsamic vinegar. Tahini. Hummus. Tomato sauce. Olives. Mushrooms. The items listed above may not be all the foods and drinks you can have. Talk to a dietitian to learn more. What foods should I limit? This is a list of foods that should be eaten rarely. Fruits Fruit canned in syrup. Vegetables Deep-fried potatoes, like [place] fries. Grains Packaged pasta or rice dishes. Cereal with added sugar. Snacks with added sugar. Meats and other proteins Beef. Pork. Lamb. Chicken or [place] with skin. Hot dogs. Helene Loader. Dairy Ice cream. Sour cream. Whole milk. Fats and oils Butter. Canola oil. Vegetable oil.  Beef fat (tallow). Lard. Beverages Juice. Sugar-sweetened soft drinks. Beer. Liquor and spirits. Sweets and desserts Cookies. Cakes. Pies. Candy. Seasonings and condiments Mayonnaise. Pre-made sauces and marinades. The items listed above may not be all the foods and drinks you should limit. Talk to a dietitian to learn more. Where to find more information American Heart Association (AHA): heart.org This information is not intended to replace advice given to you by your health care provider. Make sure you discuss any questions you have with your health care provider. Document  Revised: 02/04/2023 Document Reviewed: 02/04/2023 Elsevier Patient Education  2024 [employer].

## 2024-02-24 ENCOUNTER — Encounter: Payer: Self-pay | Admitting: Pharmacist

## 2024-02-24 DIAGNOSIS — I10 Essential (primary) hypertension: Secondary | ICD-10-CM

## 2024-02-25 ENCOUNTER — Encounter: Payer: Self-pay | Admitting: Cardiology

## 2024-02-25 NOTE — Telephone Encounter (Signed)
 Pt is calling to check status of this message. She wants to know something before she takes her meds

## 2024-02-28 ENCOUNTER — Encounter: Payer: Self-pay | Admitting: Family

## 2024-02-29 ENCOUNTER — Emergency Department (HOSPITAL_BASED_OUTPATIENT_CLINIC_OR_DEPARTMENT_OTHER)

## 2024-02-29 ENCOUNTER — Emergency Department (HOSPITAL_BASED_OUTPATIENT_CLINIC_OR_DEPARTMENT_OTHER)
Admission: EM | Admit: 2024-02-29 | Discharge: 2024-02-29 | Disposition: A | Discharge status: Home / Self Care | Attending: Emergency Medicine | Admitting: Emergency Medicine

## 2024-02-29 ENCOUNTER — Other Ambulatory Visit: Payer: Self-pay

## 2024-02-29 DIAGNOSIS — I10 Essential (primary) hypertension: Secondary | ICD-10-CM | POA: Diagnosis not present

## 2024-02-29 DIAGNOSIS — Z79899 Other long term (current) drug therapy: Secondary | ICD-10-CM | POA: Diagnosis not present

## 2024-02-29 DIAGNOSIS — D72829 Elevated white blood cell count, unspecified: Secondary | ICD-10-CM | POA: Insufficient documentation

## 2024-02-29 DIAGNOSIS — E119 Type 2 diabetes mellitus without complications: Secondary | ICD-10-CM | POA: Diagnosis not present

## 2024-02-29 DIAGNOSIS — R519 Headache, unspecified: Secondary | ICD-10-CM | POA: Diagnosis not present

## 2024-02-29 LAB — BASIC METABOLIC PANEL WITH GFR
Anion gap: 14 (ref 5–15)
BUN: 8 mg/dL (ref 6–20)
CO2: 22 mmol/L (ref 22–32)
Calcium: 9.9 mg/dL (ref 8.9–10.3)
Chloride: 104 mmol/L (ref 98–111)
Creatinine, Ser: 0.59 mg/dL (ref 0.44–1.00)
GFR, Estimated: >60 mL/min (ref >60)
Glucose, Bld: 83 mg/dL (ref 70–99)
Potassium: 3.6 mmol/L (ref 3.5–5.1)
Sodium: 139 mmol/L (ref 135–145)

## 2024-02-29 LAB — HCG, SERUM, QUALITATIVE: Preg, Serum: NEGATIVE

## 2024-02-29 LAB — CBC
HCT: 35.7 % — ABNORMAL LOW (ref 36.0–46.0)
Hemoglobin: 12.1 g/dL (ref 12.0–15.0)
MCH: 28.4 pg (ref 26.0–34.0)
MCHC: 33.9 g/dL (ref 30.0–36.0)
MCV: 83.8 fL (ref 80.0–100.0)
Platelets: 308 10*3/uL (ref 150–400)
RBC: 4.26 MIL/uL (ref 3.87–5.11)
RDW: 13.9 % (ref 11.5–15.5)
WBC: 10.8 10*3/uL — ABNORMAL HIGH (ref 4.0–10.5)
nRBC: 0 % (ref 0.0–0.2)

## 2024-02-29 MED ORDER — ACETAMINOPHEN 500 MG PO TABS
1000.0000 mg | ORAL_TABLET | Freq: Once | ORAL | Status: DC
  Filled 2024-02-29: qty 2

## 2024-02-29 MED ORDER — DIPHENHYDRAMINE HCL 50 MG/ML IJ SOLN
12.5000 mg | Freq: Once | INTRAMUSCULAR | Status: AC
  Administered 2024-02-29: 12.5 mg via INTRAVENOUS
  Filled 2024-02-29: qty 1

## 2024-02-29 MED ORDER — KETOROLAC TROMETHAMINE 15 MG/ML IJ SOLN
15.0000 mg | Freq: Once | INTRAMUSCULAR | Status: AC
  Administered 2024-02-29: 15 mg via INTRAVENOUS
  Filled 2024-02-29: qty 1

## 2024-02-29 MED ORDER — METOCLOPRAMIDE HCL 5 MG/ML IJ SOLN
10.0000 mg | Freq: Once | INTRAMUSCULAR | Status: AC
  Administered 2024-02-29: 10 mg via INTRAVENOUS
  Filled 2024-02-29: qty 2

## 2024-02-29 NOTE — Discharge Instructions (Signed)
 Follow up with PCP for increased headache. You can consider being placed on prophylactic migraine medications if symptoms continue or worsen.

## 2024-02-29 NOTE — ED Triage Notes (Signed)
 Pt POV reporting intermittent ice pick headaches past few weeks, worsened today. Denies blurred vision.

## 2024-02-29 NOTE — ED Notes (Signed)
 Assumed care of patient.

## 2024-02-29 NOTE — ED Provider Notes (Signed)
 [place] EMERGENCY DEPARTMENT AT [facility] Provider Note   CSN: [id number] Arrival date & time: 02/29/24  1804     History  Chief Complaint  Patient presents with   Headache    Erika Christian is a 28 y.o. female.  With past medical history of obesity, diabetes, hypertension is reporting to the emergency room with complaint of headache.  Patient reports that she will infrequently get headaches however over the last few weeks she has noticed that she has had worsening headache.  Reports that today when she woke up she had a mild right-sided headache around her eye and wraparound the back of her head it seemed to get gradually worse with the day.  She reports she tried Tylenol  earlier today which seemed to temporarily help.  Denies any blurry vision change in vision.  No injury trauma or fall.  No focal deficits.   Headache      Home Medications Prior to Admission medications   Medication Sig Start Date End Date Taking? Authorizing Provider  amLODipine  (NORVASC ) 5 MG tablet Take 1 tablet (5 mg total) by mouth daily. 02/22/24   Tobb, Kardie, DO      Allergies    Orange fruit [citrus], Nifedipine , and Losartan     Review of Systems   Review of Systems  Neurological:  Positive for headaches.    Physical Exam Updated Vital Signs BP 131/80 (BP Location: Left Arm)   Pulse 91   Temp 98 F (36.7 C) (Oral)   Resp 17   Ht 4\' 11"  (1.499 m)   Wt 94.3 kg   LMP 02/18/2024 (Approximate)   SpO2 100%   BMI 42.01 kg/m  Physical Exam Vitals and nursing note reviewed.  Constitutional:      General: She is not in acute distress.    Appearance: She is not toxic-appearing.  HENT:     Head: Normocephalic and atraumatic.  Eyes:     General: No scleral icterus.    Conjunctiva/sclera: Conjunctivae normal.  Cardiovascular:     Rate and Rhythm: Normal rate and regular rhythm.     Pulses: Normal pulses.     Heart sounds: Normal heart sounds.  Pulmonary:     Effort:  Pulmonary effort is normal. No respiratory distress.     Breath sounds: Normal breath sounds.  Abdominal:     General: Abdomen is flat. Bowel sounds are normal.     Palpations: Abdomen is soft.     Tenderness: There is no abdominal tenderness.  Musculoskeletal:     Comments: No tenderness over temporal region.  Skin:    General: Skin is warm and dry.     Findings: No lesion.  Neurological:     General: No focal deficit present.     Mental Status: She is alert and oriented to person, place, and time. Mental status is at baseline.     Comments: Patient is alert and oriented answering questions appropriately.  She has no ataxia no nystagmus pupils equal and reactive upper and lower extremity sensation and strength is equal bilaterally     ED Results / Procedures / Treatments   Labs (all labs ordered are listed, but only abnormal results are displayed) Labs Reviewed  CBC - Abnormal; Notable for the following components:      Result Value   WBC 10.8 (*)    HCT 35.7 (*)    All other components within normal limits  BASIC METABOLIC PANEL WITH GFR  HCG, SERUM, QUALITATIVE    EKG  None  Radiology CT HEAD WO CONTRAST Result Date: 02/29/2024 CLINICAL DATA:  Headache EXAM: CT HEAD WITHOUT CONTRAST TECHNIQUE: Contiguous axial images were obtained from the base of the skull through the vertex without intravenous contrast. RADIATION DOSE REDUCTION: This exam was performed according to the departmental dose-optimization program which includes automated exposure control, adjustment of the mA and/or kV according to patient size and/or use of iterative reconstruction technique. COMPARISON:  None Available. FINDINGS: Brain: No evidence of acute infarction, hemorrhage, hydrocephalus, extra-axial collection or mass lesion/mass effect. Vascular: No hyperdense vessel or unexpected calcification. Skull: Normal. Negative for fracture or focal lesion. Sinuses/Orbits: The visualized paranasal sinuses are  essentially clear. The mastoid air cells are unopacified. Other: None. IMPRESSION: Normal head CT. Electronically Signed   By: Zadie Herter M.D.   On: 02/29/2024 22:30    Procedures Procedures    Medications Ordered in ED Medications  acetaminophen  (TYLENOL ) tablet 1,000 mg (1,000 mg Oral Not Given 02/29/24 2117)  ketorolac  (TORADOL ) 15 MG/ML injection 15 mg (has no administration in time range)  metoCLOPramide  (REGLAN ) injection 10 mg (10 mg Intravenous Given 02/29/24 2117)  diphenhydrAMINE  (BENADRYL ) injection 12.5 mg (12.5 mg Intravenous Given 02/29/24 2117)    ED Course/ Medical Decision Making/ A&P                                 Medical Decision Making Amount and/or Complexity of Data Reviewed Labs: ordered. Radiology: ordered.  Risk OTC drugs. Prescription drug management.   Erika Christian 28 y.o. presented today for HA. Working Ddx: tension headache, migraine, intracranial mass, intracranial hemorrhage, intracranial infection including meningitis vs encephalitis, trigeminal neuralgia, AVM, sinusitis, cerebral aneurysm, muscular headache, cavernous sinus thrombosis, carotid artery dissection.  R/o DDx:  intracranial mass, hemorrhage,or infection including meningitis vs encephalitis, trigeminal neuralgia, AVM, cerebral aneurysm, muscular headache, cavernous sinus thrombosis, carotid artery dissection are less likely due to history of present illness, physical exam, labs/imaging findings  PMHX: HTN  Unique Tests and My Interpretation:  CBC with mild leukocytosis at 10.8 and no anemia.  BMP unremarkable pregnancy test negative  Problem List / ED Course / Critical interventions / Medication management  Presented to emergency room with headache.  She denies injury trauma or fall.  She reports this was not sudden onset or severe.  Right-sided associated no focal neurological deficits.  She is hemodynamically stable and well-appearing she has no significant history of  headaches will get CT scan to rule out any acute abnormality.  She has no meningeal signs on exam.  She has not been febrile does not have red flag symptoms associated with this.  Overall workup reassuring.  Head CT without any acute abnormality.  Lab work without acute abnormality.  Symptoms have improved after receiving migraine cocktail. I ordered medication including Benadryl , Reglan  and Toradol  Reevaluation of the patient after these medicines showed that the patient improved Patients vitals assessed. Upon arrival patient is hemodynamically stable.  I have reviewed the patients home medicines and have made adjustments as needed    Plan:   F/u w/ PCP in 2-3d to ensure resolution of sx.  Patient was given return precautions. Patient stable for discharge at this time.  Patient educated on sx/dx and verbalized understanding of plan. Return to ED if new or worsening sx.           Final Clinical Impression(s) / ED Diagnoses Final diagnoses:  Bad headache    Rx / DC  Orders ED Discharge Orders     None         Suzanne Erps Allison Ivory 02/29/24 2322    Tonya Fredrickson, MD 03/01/24 [phone]

## 2024-03-10 ENCOUNTER — Encounter: Payer: Self-pay | Admitting: Cardiology

## 2024-03-11 MED ORDER — HYDROCHLOROTHIAZIDE 12.5 MG PO CAPS: 12.5000 mg | ORAL_CAPSULE | Freq: Every day | ORAL | 2 refills | Status: DC

## 2024-03-11 NOTE — Addendum Note (Signed)
 Addended by: Sunny English on: 03/11/2024 01:50 PM   Modules accepted: Orders

## 2024-03-11 NOTE — Addendum Note (Signed)
 Addended by: Sunny English on: 03/11/2024 12:10 PM   Modules accepted: Orders

## 2024-03-19 NOTE — Telephone Encounter (Signed)
 Spoke with patient, states she does get headaches more frequently lately (recently seen in ED on 4/25 for HA) but since her message this morning had taken tylenol  to help with the discomfort. Patients blood pressure down to 130/84 now and feeling better. Instructed patient to continue to monitor blood pressure daily, 2 hours after medication, and to reach out to our office if needed. No further needs at this time

## 2024-03-27 NOTE — Progress Notes (Signed)
 Patient ID: Erika Christian                 DOB: 1996-02-08                      MRN: [id number]     HPI: Erika Christian is a 28 y.o. female [phone]) referred by Dr. Emmette Harms to HTN clinic. PMH is significant for HTN diagnosed after severe preeclampsia with last pregnancy, BMI > 40.   She was last seen by Dr. Emmette Harms on 02/22/24. BP in clinic was 119/82 mmHg, though patient reported that BP had been spiking at home. She was instructed to discontinue losartan  due to side effects and start amlodipine  5 mg once daily. She than reported via MyChart that amlodipine  was working well, but caused shivering and cold shakes which she attributed to hypotension. She also endorsed that her headaches returned. She asked to change medications and was recommended to start hydrochlorothiazide ,, but then decided that she would rather continue amlodipine  at 2.5 mg daily after reading about side effects with hydrochlorothiazide .   Tody, she arrives in good spirits without assistance. She reports that she is tolerating amlodipine  well. She denies s/sx of hypotension. She reports that very occasionally during the day she will have an episode of feeling a little lightheaded, but it is not persistent or frequent. Denies swelling, CP, ShOB, HA, or blurry vision. She is taking her BP at home twice daily. She reports that most home readings are controlled. She has made multiple lifestyle changes including increasing physical activity and making healthy food choices.   Current HTN meds: amlodipine  2.5 mg daily (taking it in the evening) Previously tried: metoprolol  (severe anxiety, hypotension), losartan  (drowsiness, spacey feeling), amlodipine  5 mg (shivers, headache), nifedipine  and labetalol  in previous pregnancy BP goal: <130/80 mmHg  Family History:  Family History  Problem Relation Age of Onset   Early death Mother    Hypertension Maternal Grandmother    Diabetes Paternal Grandmother    Cancer Paternal Grandmother         lung    Diet: Mediterranean diet (no bread, no rice) - ex of meal: ground tukery with broccoli or salmon with broccoli  Exercise: Walking every day - 1 mi daily   Home BP readings: Patient is checking her BP twice daily (morning and evening) 03/28/24: 114/79 5/22: 116/83 5/21: 114/79 5/20: 123/76 5/19: 126/77 5/18: 120/82 5/17: 116/90  Pulse 60-90s   Patient brings her home BP cuff for validation:  Clinic cuff: 134/80 Home cuff: 136/87 Clinic cuff: 132/83  Wt Readings from Last 3 Encounters:  03/28/24 204 lb 1.6 oz (92.6 kg)  02/29/24 208 lb (94.3 kg)  02/22/24 208 lb 3.2 oz (94.4 kg)   BP Readings from Last 3 Encounters:  03/28/24 134/80  02/29/24 99/68  02/22/24 119/82   Pulse Readings from Last 3 Encounters:  03/28/24 80  02/29/24 78  02/22/24 77    Renal function: CrCl cannot be calculated (Patient's most recent lab result is older than the maximum 21 days allowed.).     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   No current facility-administered medications on file prior to visit.    Allergies  Allergen Reactions   Orange Fruit [Citrus] Hives   Nifedipine      Migraines   Losartan      "Spacey", fatigue     Assessment/Plan:  1. Hypertension - Currently controlled with BP consistently at goal < 130/80 mmHg. Clinic BP is slightly above goal, but home cuff appears to  be accurate and is reading within 4 mmHg of clinic cuff. She is tolerating low-dose amlodipine  well. Discussed with patient that improvement in BP control is likely largely due to positive lifestyle changes.Commended for improvements! - Continue amlodipine  2.5 mg once daily - Continue monitoring blood pressure at home at least once daily 3-4 times per week. Patient instructed to notify us  if her BP begins to consistently read > 130/80 mmHg.  - Continue mediterranean diet and daily walks  Follow-up: Dr. Emmette Harms 06/11/24. Pharmacy as needed.   Arthea Larsson, PharmD PGY1 Pharmacy Resident  Joelene Murrain, PharmD, BCACP, CDCES, CPP [address], [address] [place], [place], [address] Phone: [phone], Fax: [phone]

## 2024-03-28 ENCOUNTER — Ambulatory Visit (INDEPENDENT_AMBULATORY_CARE_PROVIDER_SITE_OTHER): Admitting: Pharmacist

## 2024-03-28 ENCOUNTER — Encounter: Payer: Self-pay | Admitting: Pharmacist

## 2024-03-28 VITALS — BP 134/80 | HR 80 | Ht 59.0 in | Wt 204.1 lb

## 2024-03-28 DIAGNOSIS — I10 Essential (primary) hypertension: Secondary | ICD-10-CM

## 2024-03-28 MED ORDER — AMLODIPINE BESYLATE 2.5 MG PO TABS: 2.5000 mg | ORAL_TABLET | Freq: Every day | ORAL | 1 refills | Status: DC

## 2024-04-14 ENCOUNTER — Encounter (HOSPITAL_BASED_OUTPATIENT_CLINIC_OR_DEPARTMENT_OTHER): Payer: Self-pay | Admitting: Nurse Practitioner

## 2024-04-14 ENCOUNTER — Ambulatory Visit (HOSPITAL_BASED_OUTPATIENT_CLINIC_OR_DEPARTMENT_OTHER): Admitting: Nurse Practitioner

## 2024-04-14 VITALS — BP 131/89 | HR 87 | Ht 59.0 in | Wt 204.7 lb

## 2024-04-14 DIAGNOSIS — R0683 Snoring: Secondary | ICD-10-CM | POA: Diagnosis not present

## 2024-04-14 DIAGNOSIS — Z6841 Body Mass Index (BMI) 40.0 and over, adult: Secondary | ICD-10-CM

## 2024-04-14 DIAGNOSIS — G4719 Other hypersomnia: Secondary | ICD-10-CM

## 2024-04-14 DIAGNOSIS — I10 Essential (primary) hypertension: Secondary | ICD-10-CM

## 2024-04-14 NOTE — Assessment & Plan Note (Signed)
 Continue to monitor for goal <140/90. Follow up with PCP

## 2024-04-14 NOTE — Assessment & Plan Note (Signed)
 See above

## 2024-04-14 NOTE — Patient Instructions (Signed)
 Given your symptoms, I am concerned that you may have sleep disordered breathing with sleep apnea. You will need a sleep study for further evaluation. Someone will contact you to schedule this.   We discussed how untreated sleep apnea puts an individual at risk for cardiac arrhthymias, pulm HTN, DM, stroke and increases their risk for daytime accidents. We also briefly reviewed treatment options including weight loss, side sleeping position, oral appliance, CPAP therapy or referral to ENT for possible surgical options  Use caution when driving and pull over if you become sleepy.  Follow up in 6-8 weeks with Erika Caterra Ostroff,NP to go over sleep study results, or sooner, if needed. Friday PM virtual clinic preferred

## 2024-04-14 NOTE — Assessment & Plan Note (Signed)
 She has snoring, excessive daytime sleepiness, headaches, non restorative sleep. BMI 41. History of HTN. Given this,  I am concerned she could have sleep disordered breathing with obstructive sleep apnea. She will need sleep study for further evaluation.    - discussed how weight can impact sleep and risk for sleep disordered breathing - discussed options to assist with weight loss: combination of diet modification, cardiovascular and strength training exercises   - had an extensive discussion regarding the adverse health consequences related to untreated sleep disordered breathing - specifically discussed the risks for hypertension, coronary artery disease, cardiac dysrhythmias, cerebrovascular disease, and diabetes - lifestyle modification discussed   - discussed how sleep disruption can increase risk of accidents, particularly when driving - safe driving practices were discussed  Patient Instructions  Given your symptoms, I am concerned that you may have sleep disordered breathing with sleep apnea. You will need a sleep study for further evaluation. Someone will contact you to schedule this.   We discussed how untreated sleep apnea puts an individual at risk for cardiac arrhthymias, pulm HTN, DM, stroke and increases their risk for daytime accidents. We also briefly reviewed treatment options including weight loss, side sleeping position, oral appliance, CPAP therapy or referral to ENT for possible surgical options  Use caution when driving and pull over if you become sleepy.  Follow up in 6-8 weeks with Katie Kamyiah Colantonio,NP to go over sleep study results, or sooner, if needed. Friday PM virtual clinic preferred

## 2024-04-14 NOTE — Assessment & Plan Note (Signed)
 BMI 41. Healthy weight loss encouraged. If she does have moderate to severe sleep apnea, can consider GLP-1 with Zepbound to assist in weight loss measures. Will readdress at follow up. Encouraged to continue focusing on diet and exercise in interim.

## 2024-04-14 NOTE — Progress Notes (Signed)
 Epworth Sleepiness Scale  Use the following scale to choose the most appropriate number for each situation. 0 Would never nod off 1  Slight  chance of nodding off 2 Moderate chance of nodding off 3 High chance of nodding off  Sitting and reading: 0 Watching TV: 1 Sitting, inactive, in a public place (e.g., in a meeting, theater, or dinner event): 0 As a passenger in a car for an hour or more without stopping for a break: 0 Lying down to rest when circumstances permit:1 Sitting and talking to someone: 0 Sitting quietly after a meal without alcohol: 0 In a car, while stopped for a few  minutes in traffic or at a light: 0  TOTOAL: 2

## 2024-04-14 NOTE — Progress Notes (Signed)
 @Patient  ID: Erika Christian, female    DOB: [birth year], 28 y.o.   MRN: [id number]  Chief Complaint  Patient presents with   Establish Care    Sleep consult    Referring provider: Tobb, Kardie, DO  HPI: 28 year old female, never smoker referred for sleep consult. Past medical history significant for HTN, allergic rhinitis, obesity, hx of pre-eclampsia.   TEST/EVENTS:   04/14/2024: Today - sleep consult Discussed the use of AI scribe software for clinical note transcription with the patient, who gave verbal consent to proceed.  History of Present Illness   Erika Christian "Erika Christian" is a 28 year old female who presents for evaluation of potential sleep apnea.  She has experienced high blood pressure since the birth of her daughter, who is now 41 months old. Similar issues occurred postpartum with her son but resolved within weeks. She had pre-eclampsia with her son during pregnancy and postpartum with her daughter. She is now on amlodipine , which is helping manage her BP. Her weight has increased from approximately 170 pounds to 203 pounds.  She experiences snoring, particularly when very tired, but denies being told she stops breathing during sleep. She feels very sleepy during the day, attributing this to her medication, which she believes causes drowsiness. She experiences headaches throughout the day; she has been diagnosed with migraines. These sometimes occur in the morning. No drowsy driving, sleepwalking, or sleep paralysis. She wakes up twice a night to attend to her children.  She goes to bed between 10-11 pm. Falls asleep within 30 minutes. Gets up around 6:45 am. No sleep aids. 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 No prior sleep study.  She follows a Mediterranean diet, avoiding bread, rice, and chips. She does not consume alcohol or caffeine. She is actively working on weight loss through diet and exercise.   Lives with her husband and children. Works as an Recruitment consultant.   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 90 tablet 1   No facility-administered medications prior to visit.     Review of Systems:   Constitutional: No night sweats, fevers, chills, or lassitude. +fatigue, weight gain  HEENT: No difficulty swallowing, tooth/dental problems, or sore throat. No sneezing, itching, ear ache, nasal congestion, or post nasal drip +headaches  CV:  No chest pain, orthopnea, PND, swelling in lower extremities, anasarca, dizziness, palpitations, syncope  Resp: +snoring; baseline shortness of breath with exertion. No cough GI:  No heartburn, indigestion GU: No nocturia  Skin: No rash, lesions, ulcerations MSK:  No joint pain or swelling.   Neuro: No dizziness  or lightheadedness.  Psych: No depression or anxiety. Mood stable.     Physical Exam:  BP 131/89   Pulse 87   Ht 4\' 11"  (1.499 m)   Wt 204 lb 11.2 oz (92.9 kg)   SpO2 100%   BMI 41.34 kg/m   GEN: Pleasant, interactive, well-appearing; morbidly obese; in no acute distress HEENT:  Normocephalic and atraumatic. PERRLA. Sclera white. Nasal  turbinates pink, moist and patent bilaterally. No rhinorrhea present. Oropharynx pink and moist, without exudate or edema. No lesions, ulcerations, or postnasal drip. Mallampati III/IV NECK:  Supple w/ fair ROM.Thyroid  symmetrical with no goiter or nodules palpated. No lymphadenopathy.   CV: RRR, no m/r/g, no peripheral edema. Pulses intact, +2 bilaterally. No cyanosis, pallor or clubbing. PULMONARY:  Unlabored, regular breathing. Clear bilaterally A&P w/o wheezes/rales/rhonchi. No accessory muscle use.  GI: BS present and normoactive. Soft, non-tender to palpation. No organomegaly or masses detected. MSK: No erythema, warmth or tenderness. Cap refil <2 sec all extrem. No deformities or joint swelling noted.  Neuro: A/Ox3. No focal deficits noted.   Skin: Warm, no lesions or rashe Psych: Normal affect and behavior. Judgement and thought content appropriate.     Lab Results:  CBC    Component Value Date/Time   WBC 10.8 (H) 02/29/2024 2101   RBC 4.26 02/29/2024 2101   HGB 12.1 02/29/2024 2101   HGB 10.7 (L) 03/30/2022 0829   HCT 35.7 (L) 02/29/2024 2101   HCT 32.0 (L) 03/30/2022 0829   PLT 308 02/29/2024 2101   PLT 237 03/30/2022 0829   MCV 83.8 02/29/2024 2101   MCV 85 03/30/2022 0829   MCH 28.4 02/29/2024 2101   MCHC 33.9 02/29/2024 2101   RDW 13.9 02/29/2024 2101   RDW 13.8 03/30/2022 0829   LYMPHSABS 2.9 05/28/2023 1100   MONOABS 0.4 05/28/2023 1100   EOSABS 0.1 05/28/2023 1100   BASOSABS 0.0 05/28/2023 1100    BMET    Component Value Date/Time   NA 139 02/29/2024 2101   NA 137 01/05/2022 1134   K 3.6 02/29/2024 2101   CL 104 02/29/2024 2101   CO2 22 02/29/2024 2101   GLUCOSE 83 02/29/2024 2101   BUN 8 02/29/2024 2101   BUN 8 01/05/2022 1134   CREATININE 0.59 02/29/2024 2101   CALCIUM 9.9 02/29/2024 2101   GFRNONAA >60 02/29/2024 2101   GFRAA >60 05/23/2019 0950    BNP No results found for: "BNP"   Imaging:  No results found.  Administration History      None           No data to display          No results found for: "NITRICOXIDE"      Assessment & Plan:   Excessive daytime sleepiness She has snoring, excessive daytime sleepiness, headaches, non restorative sleep. BMI 41. History of HTN. Given this,  I am concerned she could have sleep disordered breathing with obstructive sleep apnea. She will need sleep study for further evaluation.    - discussed how weight can impact sleep and risk for sleep disordered breathing - discussed options to assist with weight loss: combination of diet modification, cardiovascular and strength training exercises   - had an extensive discussion regarding the adverse health consequences related to untreated sleep disordered breathing - specifically discussed the risks for hypertension, coronary artery disease, cardiac dysrhythmias, cerebrovascular disease, and diabetes - lifestyle modification discussed   - discussed how sleep disruption can increase risk of accidents, particularly when driving -  safe driving practices were discussed  Patient Instructions  Given your symptoms, I am concerned that you may have sleep disordered breathing with sleep apnea. You will need a sleep study for further evaluation. Someone will contact you to schedule this.   We discussed how untreated sleep apnea puts an individual at risk for cardiac arrhthymias, pulm HTN, DM, stroke and increases their risk for daytime accidents. We also briefly reviewed treatment options including weight loss, side sleeping position, oral appliance, CPAP therapy or referral to ENT for possible surgical options  Use caution when driving and pull over if you become sleepy.  Follow up in 6-8 weeks with Katie Kamani Magnussen,NP to go over sleep study results, or sooner, if needed. Friday PM virtual clinic preferred       Snoring See above  Morbid obesity (HCC) BMI 41. Healthy weight loss encouraged. If she does have moderate to severe  sleep apnea, can consider GLP-1 with Zepbound to assist in weight loss measures. Will readdress at follow up. Encouraged to continue focusing on diet and exercise in interim.   Primary hypertension Continue to monitor for goal <140/90. Follow up with PCP   Advised if symptoms do not improve or worsen, to please contact office for sooner follow up or seek emergency care.   I spent 45 minutes of dedicated to the care of this patient on the date of this encounter to include pre-visit review of records, face-to-face time with the patient discussing conditions above, post visit ordering of testing, clinical documentation with the electronic health record, making appropriate referrals as documented, and communicating necessary findings to members of the patients care team.  Roetta Clarke, NP 04/14/2024  Pt aware and understands NP's role.

## 2024-04-17 ENCOUNTER — Encounter (HOSPITAL_BASED_OUTPATIENT_CLINIC_OR_DEPARTMENT_OTHER): Payer: Self-pay

## 2024-04-17 NOTE — Telephone Encounter (Signed)
**Note De-identified  [place] Obfuscation** Please advise 

## 2024-04-18 NOTE — Addendum Note (Signed)
 Addended by: Delane Wessinger V on: 04/18/2024 12:19 PM   Modules accepted: Orders

## 2024-04-18 NOTE — Telephone Encounter (Signed)
 Ordered in lab split night sleep study Please get in touch with [facility] May not be covered by insurance but they can run a prior auth to see. Thanks

## 2024-04-24 NOTE — Telephone Encounter (Signed)
Can you answer this?

## 2024-04-25 ENCOUNTER — Ambulatory Visit

## 2024-04-25 DIAGNOSIS — G4719 Other hypersomnia: Secondary | ICD-10-CM

## 2024-04-29 NOTE — Telephone Encounter (Signed)
 Can you switch her to virtual?

## 2024-05-05 ENCOUNTER — Encounter: Payer: Self-pay | Admitting: Pharmacist

## 2024-05-06 ENCOUNTER — Encounter: Payer: Self-pay | Admitting: Family

## 2024-05-08 ENCOUNTER — Encounter (HOSPITAL_BASED_OUTPATIENT_CLINIC_OR_DEPARTMENT_OTHER): Payer: Self-pay

## 2024-05-13 ENCOUNTER — Encounter: Payer: Self-pay | Admitting: Family

## 2024-05-13 ENCOUNTER — Encounter (HOSPITAL_BASED_OUTPATIENT_CLINIC_OR_DEPARTMENT_OTHER): Payer: Self-pay

## 2024-05-13 NOTE — Telephone Encounter (Signed)
 Please advise if any thoughts

## 2024-05-14 NOTE — Telephone Encounter (Signed)
 there is no test for TMJ and treatment is not different than for other migraine but we can discuss during visit - she should also discuss with her dentist as sometimes wearing a mouthguard overnight can help with pain, thanks.

## 2024-05-20 NOTE — Telephone Encounter (Signed)
 It can be. Will have to wait on HST results. Recommend she let her dentist know about the TMJ but if she does have sleep apnea, treating it should help! Thanks!

## 2024-05-23 ENCOUNTER — Encounter (HOSPITAL_BASED_OUTPATIENT_CLINIC_OR_DEPARTMENT_OTHER): Payer: Self-pay

## 2024-05-23 ENCOUNTER — Ambulatory Visit: Payer: Self-pay | Admitting: Nurse Practitioner

## 2024-05-23 DIAGNOSIS — R0683 Snoring: Secondary | ICD-10-CM

## 2024-05-23 DIAGNOSIS — G4719 Other hypersomnia: Secondary | ICD-10-CM

## 2024-05-23 NOTE — Progress Notes (Signed)
 No significant sleep apnea; however, she had oxygen levels that dropped to 79%. Recommend in lab split night sleep study for further evaluation. Thanks.

## 2024-05-26 NOTE — Telephone Encounter (Signed)
 Order placed

## 2024-05-27 ENCOUNTER — Encounter: Payer: Self-pay | Admitting: Cardiology

## 2024-05-28 ENCOUNTER — Encounter: Payer: Self-pay | Admitting: Family

## 2024-05-28 ENCOUNTER — Encounter (HOSPITAL_BASED_OUTPATIENT_CLINIC_OR_DEPARTMENT_OTHER): Payer: Self-pay | Admitting: Internal Medicine

## 2024-05-28 ENCOUNTER — Ambulatory Visit: Admitting: Family

## 2024-05-28 ENCOUNTER — Encounter: Payer: Medicaid Other | Admitting: Family

## 2024-05-28 ENCOUNTER — Encounter: Payer: Self-pay | Admitting: Pharmacist

## 2024-05-28 VITALS — BP 121/82 | HR 73 | Temp 97.9°F | Ht 59.0 in | Wt 203.8 lb

## 2024-05-28 DIAGNOSIS — Z0000 Encounter for general adult medical examination without abnormal findings: Secondary | ICD-10-CM

## 2024-05-28 DIAGNOSIS — R7303 Prediabetes: Secondary | ICD-10-CM | POA: Diagnosis not present

## 2024-05-28 DIAGNOSIS — I10 Essential (primary) hypertension: Secondary | ICD-10-CM

## 2024-05-28 LAB — CBC WITH DIFFERENTIAL/PLATELET
Basophils Absolute: 0 K/uL (ref 0.0–0.1)
Basophils Relative: 0.6 % (ref 0.0–3.0)
Eosinophils Absolute: 0.1 K/uL (ref 0.0–0.7)
Eosinophils Relative: 0.9 % (ref 0.0–5.0)
HCT: 37.9 % (ref 36.0–46.0)
Hemoglobin: 12.5 g/dL (ref 12.0–15.0)
Lymphocytes Relative: 36 % (ref 12.0–46.0)
Lymphs Abs: 2.8 K/uL (ref 0.7–4.0)
MCHC: 32.9 g/dL (ref 30.0–36.0)
MCV: 84.3 fl (ref 78.0–100.0)
Monocytes Absolute: 0.3 K/uL (ref 0.1–1.0)
Monocytes Relative: 4.4 % (ref 3.0–12.0)
Neutro Abs: 4.5 K/uL (ref 1.4–7.7)
Neutrophils Relative %: 58.1 % (ref 43.0–77.0)
Platelets: 322 K/uL (ref 150.0–400.0)
RBC: 4.49 Mil/uL (ref 3.87–5.11)
RDW: 14.3 % (ref 11.5–15.5)
WBC: 7.8 K/uL (ref 4.0–10.5)

## 2024-05-28 LAB — LIPID PANEL
Cholesterol: 167 mg/dL (ref 0–200)
HDL: 48.8 mg/dL (ref >39.00)
LDL Cholesterol: 110 mg/dL — ABNORMAL HIGH (ref 0–99)
NonHDL: 118.24
Total CHOL/HDL Ratio: 3
Triglycerides: 42 mg/dL (ref 0.0–149.0)
VLDL: 8.4 mg/dL (ref 0.0–40.0)

## 2024-05-28 LAB — COMPREHENSIVE METABOLIC PANEL WITH GFR
ALT: 9 U/L (ref 0–35)
AST: 14 U/L (ref 0–37)
Albumin: 4.5 g/dL (ref 3.5–5.2)
Alkaline Phosphatase: 61 U/L (ref 39–117)
BUN: 8 mg/dL (ref 6–23)
CO2: 23 meq/L (ref 19–32)
Calcium: 9.3 mg/dL (ref 8.4–10.5)
Chloride: 107 meq/L (ref 96–112)
Creatinine, Ser: 0.58 mg/dL (ref 0.40–1.20)
GFR: 123.08 mL/min (ref >60.00)
Glucose, Bld: 86 mg/dL (ref 70–99)
Potassium: 4 meq/L (ref 3.5–5.1)
Sodium: 139 meq/L (ref 135–145)
Total Bilirubin: 0.3 mg/dL (ref 0.2–1.2)
Total Protein: 7.5 g/dL (ref 6.0–8.3)

## 2024-05-28 LAB — HEMOGLOBIN A1C: Hgb A1c MFr Bld: 5.8 % (ref 4.6–6.5)

## 2024-05-28 LAB — TSH: TSH: 1.04 u[IU]/mL (ref 0.35–5.50)

## 2024-05-28 NOTE — Progress Notes (Signed)
 Phone [phone]  Subjective:   Patient is a 28 y.o. female presenting for annual physical.    Chief Complaint  Patient presents with   Annual Exam    Fasting w/ labs  Discussed the use of AI scribe software for clinical note transcription with the patient, who gave verbal consent to proceed.  History of Present Illness Erika Christian is a 28 year old female who presents for a physical exam and evaluation of  borderline prediabetes.  Daytime somnolence - Significant daytime sleepiness present - Completed a sleep study; follow-up home sleep study scheduled for August 25th, 2025  Impaired glucose tolerance - Concerned about borderline prediabetes status - Requests repeat diabetes testing - Adheres to a Mediterranean diet with focus on low-carbohydrate foods, avoidance of red meat, bread, and potatoes - Weight loss achieved; current weight is 215 pounds  Hypertension - Takes antihypertensive medication at a dose of 2.5 mg - Monitors blood pressure twice weekly - Avoids alcohol due to blood pressure elevation and headaches, especially with wine  Temporomandibular joint (tmj) pain - TMJ-related pain, especially with chewing - Headaches localized to the temple area - Upcoming appointment with dental team for TMJ evaluation  Physical activity - Engages in regular physical activity, including walking outdoors and using an indoor walking machine during inclement weather - Aims for 100 to 200 steps on the walking machine  Tobacco and contraceptive use - No tobacco use - Uses condoms for contraception as needed  See problem oriented charting- ROS- full  review of systems was completed and negative.  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   Shingles    Patient Active Problem List   Diagnosis Date  Noted   Excessive daytime sleepiness 04/14/2024   Snoring 04/14/2024   Seasonal allergic rhinitis due to pollen 01/28/2024   Family history of diabetes mellitus 10/25/2023   Primary hypertension 11/27/2022   Morbid obesity (HCC) 11/27/2022   History of severe pre-eclampsia 01/06/2022   Rubella non-immune status, antepartum 01/06/2022   Past Surgical History:  Procedure Laterality Date   APPENDECTOMY  2002    Family History  Problem Relation Age of Onset   Early death Mother    Hypertension Maternal Grandmother    Diabetes Paternal Grandmother    Cancer Paternal Grandmother        lung    Medications- reviewed and updated Current Outpatient Medications  Medication Sig Dispense Refill   amLODipine  (NORVASC ) 2.5 MG tablet Take 1 tablet (2.5 mg total) by mouth daily. 90 tablet 1   No current facility-administered medications for this visit.    Allergies-reviewed and updated Allergies  Allergen Reactions   Orange Fruit [Citrus] Hives   Nifedipine      Migraines   Losartan      Spacey, fatigue    Social History   Social History Narrative   Not on file    Objective:  BP 121/82 (BP Location: Left Arm, Patient Position: Sitting, Cuff Size: Large)   Pulse 73   Temp 97.9 F (36.6 C) (Temporal)   Ht 4' 11 (1.499 m)   Wt 203 lb 12.8 oz (92.4 kg)   LMP 05/14/2024 (Approximate)   SpO2 100%   Breastfeeding No   BMI 41.16 kg/m  Physical Exam Vitals and nursing note reviewed.  Constitutional:      Appearance: Normal appearance. She  is obese.  HENT:     Head: Normocephalic.     Right Ear: Tympanic membrane normal.     Left Ear: Tympanic membrane normal.     Nose: Nose normal.     Mouth/Throat:     Mouth: Mucous membranes are moist.  Eyes:     Pupils: Pupils are equal, round, and reactive to light.  Cardiovascular:     Rate and Rhythm: Normal rate and regular rhythm.  Pulmonary:     Effort: Pulmonary effort is normal.     Breath sounds: Normal breath sounds.   Musculoskeletal:        General: Normal range of motion.     Cervical back: Normal range of motion.  Lymphadenopathy:     Cervical: No cervical adenopathy.  Skin:    General: Skin is warm and dry.  Neurological:     Mental Status: She is alert.  Psychiatric:        Mood and Affect: Mood normal.        Behavior: Behavior normal.     Assessment and Plan   Health Maintenance counseling: 1. Anticipatory guidance: Patient counseled regarding regular dental exams q6 months, eye exams,  avoiding smoking and second hand smoke, limiting alcohol to 1 beverage per day, no illicit drugs.   2. Risk factor reduction:  Advised patient of need for regular exercise and diet rich with fruits and vegetables to reduce risk of heart attack and stroke. Wt Readings from Last 3 Encounters:  05/28/24 203 lb 12.8 oz (92.4 kg)  04/14/24 204 lb 11.2 oz (92.9 kg)  03/28/24 204 lb 1.6 oz (92.6 kg)   3. Immunizations/screenings/ancillary studies Immunization History  Administered Date(s) Administered   HPV Quadrivalent 05/06/2010, 06/26/2011, 10/17/2012   Influenza-Unspecified 09/06/2021   PFIZER(Purple Top)SARS-COV-2 Vaccination 10/08/2020, 10/29/2020   PPD Test 04/11/2018   Tdap 04/10/2019, 05/15/2022   There are no preventive care reminders to display for this patient.   4. Cervical cancer screening: PAP smear done 2024 5. Skin cancer screening- advised regular sunscreen use. Denies worrisome, changing, or new skin lesions.  6. Birth control/STD check: Condoms/N/A  7. Smoking associated screening: non- smoker 8. Alcohol screening: none  Assessment & Plan Sleep Apnea Diagnosed with sleep apnea, contributing to daytime sleepiness. Follow-up sleep study scheduled to evaluate condition and oxygen levels during sleep. - Conduct follow-up sleep study on August 25.  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 Scheduled for dental  evaluation. - Attend TMJ evaluation appointment with dentist next Wednesday.  Prediabetes Concerned about glucose levels. Emphasized weight management and dietary modifications, focusing on a low-carb, continue Mediterranean diet and look for low glycemic index of foods. - Order A1c test to assess current prediabetes level. - Encourage adherence to a low-carb, Mediterranean diet. - Discuss glycemic index and its role in dietary choices. - Increase cardio exercise as many days as able. - Will continue to monitor.  Hypertension Blood pressure well-controlled on current medication. Followed by Cardiology. Monitors blood pressure regularly and avoids alcohol. - Continue current Amlodipine  medication regimen. - Advise continued avoidance of alcohol. - Continue to follow with Cardiology  General Health Maintenance/Morbid Obesity Engaged in lifestyle modifications, follows Mediterranean diet, exercises, and avoids smoking and alcohol. Interested in exploring new foods and recipes. - Order full CPE lab panel today - Encourage continued adherence to Mediterranean diet and regular exercise. - Consider referral to a nutritionist for further dietary guidance. - Discuss potential benefits of trying  new foods and recipes.  Recommended follow up:  Return for any future concerns, Complete physical w/fasting labs. Future Appointments  Date Time Provider Department Center  06/04/2024 11:30 AM Malachy Comer GAILS, NP [facility]  06/11/2024  9:00 AM Tobb, Kardie, DO [facility]  06/30/2024  8:00 PM Neysa Reggy BIRCH, MD [facility]    Lab/Order associations:  fasting   Lucius Krabbe, NP

## 2024-05-28 NOTE — Patient Instructions (Addendum)
 It was very nice to see you today!   I will review your lab results via MyChart in a few days.  Great job with your weight loss, keep it up!  Keep exercising! Let me know if you would lke to see a nutritionist.       PLEASE NOTE:  If you had any lab tests please let us  know if you have not heard back within a few days. You may see your results on MyChart before we have a chance to review them but we will give you a call once they are reviewed by us . If we ordered any referrals today, please let us  know if you have not heard from their office within the next week.

## 2024-05-28 NOTE — Assessment & Plan Note (Signed)
 Concerned about glucose levels. Emphasized weight management and dietary modifications, focusing on a low-carb, continue Mediterranean diet and look for low glycemic index of foods. - Order A1c test to assess current prediabetes level. - Encourage adherence to a low-carb, Mediterranean diet. - Discuss glycemic index and its role in dietary choices. - Increase cardio exercise as many days as able. - Will continue to monitor.

## 2024-05-28 NOTE — Assessment & Plan Note (Signed)
 Blood pressure well-controlled on current medication. Followed by Cardiology. Monitors blood pressure regularly and avoids alcohol. - Continue current Amlodipine  medication regimen. - Advise continued avoidance of alcohol. - Continue to follow with Cardiology

## 2024-05-28 NOTE — Assessment & Plan Note (Signed)
 Engaged in lifestyle modifications, follows Mediterranean diet, exercises, and avoids smoking and alcohol. Interested in exploring new foods and recipes. Possible use of Zepbound if sleep apnea confirmed. - Encourage continued adherence to Mediterranean diet and regular exercise, reviewed high fiber carb options, avoid white carbs & processed foods, 2-3 servings of whole fruit per day - Discussed portion control, eating just protein & vege at supper - Hydrate with mostly water, 2.5L of caffeine beverages per day. - Consider referral to a nutritionist for further dietary guidance. - Discuss potential benefits of trying new foods and recipes.

## 2024-05-29 ENCOUNTER — Encounter: Payer: Self-pay | Admitting: Family

## 2024-05-29 MED ORDER — AMLODIPINE BESYLATE 2.5 MG PO TABS: 2.5000 mg | ORAL_TABLET | Freq: Every day | ORAL | 1 refills | Status: DC

## 2024-06-02 ENCOUNTER — Ambulatory Visit: Payer: Self-pay | Admitting: Family

## 2024-06-02 NOTE — Telephone Encounter (Signed)
 reviewed labs in separate message.

## 2024-06-03 ENCOUNTER — Encounter: Admitting: Family

## 2024-06-04 ENCOUNTER — Encounter (HOSPITAL_BASED_OUTPATIENT_CLINIC_OR_DEPARTMENT_OTHER): Payer: Self-pay

## 2024-06-04 ENCOUNTER — Ambulatory Visit (HOSPITAL_BASED_OUTPATIENT_CLINIC_OR_DEPARTMENT_OTHER): Admitting: Nurse Practitioner

## 2024-06-11 ENCOUNTER — Ambulatory Visit: Admitting: Cardiology

## 2024-06-11 ENCOUNTER — Encounter: Payer: Self-pay | Admitting: Cardiology

## 2024-06-11 ENCOUNTER — Ambulatory Visit: Attending: Cardiology | Admitting: Cardiology

## 2024-06-11 VITALS — BP 123/60 | HR 93 | Ht 59.0 in | Wt 208.4 lb

## 2024-06-11 DIAGNOSIS — R7303 Prediabetes: Secondary | ICD-10-CM | POA: Diagnosis not present

## 2024-06-11 DIAGNOSIS — I10 Essential (primary) hypertension: Secondary | ICD-10-CM | POA: Insufficient documentation

## 2024-06-11 MED ORDER — AMLODIPINE BESYLATE 2.5 MG PO TABS: 2.5000 mg | ORAL_TABLET | Freq: Every day | ORAL | 3 refills | Status: AC

## 2024-06-11 NOTE — Patient Instructions (Signed)

## 2024-06-11 NOTE — Progress Notes (Signed)
 [facility]  Follow Up Note   Date:  06/11/2024   ID:  Erika Christian, DOB Apr 17, 1996, MRN [id number]  PCP:  Lucius Krabbe, NP   [facility] Providers Cardiologist:  Dub Huntsman, DO  Electrophysiologist:  None        Referring MD: Lucius Krabbe, NP   Chief Complaint:  I am ok  History of Present Illness:    Erika Christian is a 28 y.o. female [G2P2002] who returns for follow up.  Medical history includes preeclampsia, chronic hypertension and prediabetes here today for a follow up visit. She offers no complaints at this time. She is doing well from a CV standpoint.   No complaints    Prior CV Studies Reviewed: The following studies were reviewed today: None   Past Medical History:  Diagnosis Date   BMI 40.0-44.9, adult (HCC) 01/06/2022   HTN (hypertension)    Preeclampsia    Recurrent UTI 02/27/2022   Shingles     Past Surgical History:  Procedure Laterality Date   APPENDECTOMY  2002      OB History     Gravida  2   Para  2   Term  2   Preterm  0   AB  0   Living  2      SAB  0   IAB  0   Ectopic  0   Multiple  0   Live Births  2               Current Medications: Current Meds  Medication Sig   [DISCONTINUED] amLODipine  (NORVASC ) 2.5 MG tablet Take 1 tablet (2.5 mg total) by mouth daily.     Allergies:   Orange fruit [citrus], Nifedipine , and Losartan    Social History   Socioeconomic History   Marital status: Married    Spouse name: Antonia Cardinal   Number of children: 1   Years of education: Not on file   Highest education level: Associate degree: academic program  Occupational History   Not on file  Tobacco Use   Smoking status: Never    Passive exposure: Never   Smokeless tobacco: Never  Vaping Use   Vaping status: Never Used  Substance and Sexual Activity   Alcohol use: Not Currently   Drug use: Never   Sexual activity: Not Currently    Birth control/protection: None   Other Topics Concern   Not on file  Social History Narrative   Not on file   Social Drivers of Health   Financial Resource Strain: Low Risk  (12/08/2021)   Overall Financial Resource Strain (CARDIA)    Difficulty of Paying Living Expenses: Not hard at all  Food Insecurity: No Food Insecurity (12/08/2021)   Hunger Vital Sign    Worried About Running Out of Food in the Last Year: Never true    Ran Out of Food in the Last Year: Never true  Transportation Needs: No Transportation Needs (12/08/2021)   PRAPARE - Administrator, Civil Service (Medical): No    Lack of Transportation (Non-Medical): No  Physical Activity: Insufficiently Active (12/08/2021)   Exercise Vital Sign    Days of Exercise per Week: 2 days    Minutes of Exercise per Session: 30 min  Stress: No Stress Concern Present (12/08/2021)   [employer] of Occupational Health - Occupational Stress Questionnaire    Feeling of Stress : Not at all  Social Connections: Unknown (03/21/2022)   Received from [facility]   Social  Network    Social Network: Not on file      Family History  Problem Relation Age of Onset   Early death Mother    Hypertension Maternal Grandmother    Diabetes Paternal Grandmother    Cancer Paternal Grandmother        lung      ROS:   Please see the history of present illness.     All other systems reviewed and are negative.   Labs/EKG Reviewed:    EKG:  None today   Recent Labs: 05/28/2024: ALT 9; BUN 8; Creatinine, Ser 0.58; Hemoglobin 12.5; Platelets 322.0; Potassium 4.0; Sodium 139; TSH 1.04   Recent Lipid Panel Lab Results  Component Value Date/Time   CHOL 167 05/28/2024 09:39 AM   TRIG 42.0 05/28/2024 09:39 AM   HDL 48.80 05/28/2024 09:39 AM   CHOLHDL 3 05/28/2024 09:39 AM   LDLCALC 110 (H) 05/28/2024 09:39 AM    Physical Exam:    VS:  BP 123/60   Pulse 93   Ht 4' 11 (1.499 m)   Wt 208 lb 6.4 oz (94.5 kg)   LMP 05/14/2024 (Approximate)   SpO2 98%   BMI  42.09 kg/m     Wt Readings from Last 3 Encounters:  06/11/24 208 lb 6.4 oz (94.5 kg)  05/28/24 203 lb 12.8 oz (92.4 kg)  04/14/24 204 lb 11.2 oz (92.9 kg)     GEN:  Well nourished, well developed in no acute distress HEENT: Normal NECK: No JVD; No carotid bruits LYMPHATICS: No lymphadenopathy CARDIAC: RRR, no murmurs, rubs, gallops RESPIRATORY:  Clear to auscultation without rales, wheezing or rhonchi  ABDOMEN: Soft, non-tender, non-distended MUSCULOSKELETAL:  No edema; No deformity  SKIN: Warm and dry NEUROLOGIC:  Alert and oriented x 3 PSYCHIATRIC:  Normal affect    Risk Assessment/Risk Calculators:                 ASSESSMENT & PLAN:    Hypertension Prediabetes  Morbid obesity  Her blood pressure is at target and is tolerating the amlodipine  2.5 mg daily.  Changes will be made to her medication regimen. We reviewed her recent blood work showing hemoglobin A1c 5.8 we talked about diet modification. The patient understands the need to lose weight with diet and exercise. We have discussed specific strategies for this.  The patient is in agreement with the above plan. The patient left the office in stable condition.  The patient will follow up in 1 year or sooner if needed  Patient Instructions  Medication Instructions:  Your physician recommends that you continue on your current medications as directed. Please refer to the Current Medication list given to you today.  *If you need a refill on your cardiac medications before your next appointment, please call your pharmacy*  Follow-Up: At [facility], you and your health needs are our priority.  As part of our continuing mission to provide you with exceptional heart care, our providers are all part of one team.  This team includes your primary Cardiologist (physician) and Advanced Practice Providers or APPs (Physician Assistants and Nurse Practitioners) who all work together to provide you with the care you  need, when you need it.  Your next appointment:   1 year(s)  Provider:   Harlee Pursifull, DO           Dispo:  No follow-ups on file.   Medication Adjustments/Labs and Tests Ordered: Current medicines are reviewed at length with the patient today.  Concerns  regarding medicines are outlined above.  Tests Ordered: No orders of the defined types were placed in this encounter.  Medication Changes: Meds ordered this encounter  Medications   amLODipine  (NORVASC ) 2.5 MG tablet    Sig: Take 1 tablet (2.5 mg total) by mouth daily.    Dispense:  90 tablet    Refill:  3

## 2024-06-27 NOTE — Telephone Encounter (Signed)
 Can we look into this?

## 2024-06-30 ENCOUNTER — Encounter (HOSPITAL_BASED_OUTPATIENT_CLINIC_OR_DEPARTMENT_OTHER): Admitting: Internal Medicine

## 2024-07-01 NOTE — Telephone Encounter (Signed)
 Per appt notes  Denied auth - RB   Order Specific Questions  Where should this test be performed:  [facility]          Did anyone let the patient know

## 2024-07-04 ENCOUNTER — Encounter (HOSPITAL_BASED_OUTPATIENT_CLINIC_OR_DEPARTMENT_OTHER): Admitting: Internal Medicine

## 2024-08-05 ENCOUNTER — Encounter: Payer: Self-pay | Admitting: Family

## 2024-08-05 ENCOUNTER — Ambulatory Visit: Admitting: Family

## 2024-08-05 VITALS — BP 117/82 | HR 93 | Temp 97.7°F | Ht 59.0 in | Wt 207.2 lb

## 2024-08-05 DIAGNOSIS — J029 Acute pharyngitis, unspecified: Secondary | ICD-10-CM

## 2024-08-05 LAB — POCT RAPID STREP A (OFFICE): Rapid Strep A Screen: NEGATIVE

## 2024-08-05 LAB — POC COVID19 BINAXNOW: SARS Coronavirus 2 Ag: NEGATIVE

## 2024-08-05 LAB — POCT INFLUENZA A/B
Influenza A, POC: NEGATIVE
Influenza B, POC: NEGATIVE

## 2024-08-05 NOTE — Progress Notes (Signed)
 Patient ID: Erika Christian, female    DOB: Oct 27, 1996, 28 y.o.   MRN: [id number]  Chief Complaint  Patient presents with   Sore Throat    Pt c/o sore throat, chill, runny nose and body aches. Present since Sunday but has been worsening. Has tried nyquil, cough drops, tea.   Discussed the use of AI scribe software for clinical note transcription with the patient, who gave verbal consent to proceed.  History of Present Illness   Erika Christian is a 28 year old female who presents with symptoms of an upper respiratory infection.  She has a sore throat and dry cough, with the sore throat being the most bothersome. She uses over-the-counter medications, Thera-flu and Tylenol . She is cautious about medications that might elevate her blood pressure, as she is on a low dose of medication for hypertension.  She continues to work despite symptoms, but her boss advised her to stay home to prevent spreading the illness. She uses a mask to minimize transmission risk. She has not experienced any allergy symptoms this season.  She confirms the absence of fever and significant nasal congestion. Her cough remains dry.     Assessment and Plan    Acute upper respiratory infection  With sore throat and cough, Likely viral etiology, no fever, throat examination unremarkable. Rapid strep, flu, and covid negative. - Ibuprofen  2-3 tablets TID for 2-3 days. - Warm salt water gargles PRN. - Generic OTC Mucinex for cough. - Benadryl  at bedtime for better sleep, sx control overnight. - Increase fluids and rest. - Call back if sx are not improving      Subjective:    Outpatient Medications Prior to Visit  Medication Sig Dispense Refill   amLODipine  (NORVASC ) 2.5 MG tablet Take 1 tablet (2.5 mg total) by mouth daily. 90 tablet 3   No facility-administered medications prior to visit.   Past Medical History:  Diagnosis Date   BMI 40.0-44.9, adult (HCC) 01/06/2022   HTN (hypertension)     Preeclampsia    Recurrent UTI 02/27/2022   Shingles    Past Surgical History:  Procedure Laterality Date   APPENDECTOMY  2002   Allergies  Allergen Reactions   Orange Fruit [Citrus] Hives   Nifedipine      Migraines   Losartan      Spacey, fatigue      Objective:    Physical Exam Vitals and nursing note reviewed.  Constitutional:      Appearance: Normal appearance. She is ill-appearing.     Interventions: Face mask in place.  HENT:     Right Ear: Tympanic membrane and ear canal normal.     Left Ear: Tympanic membrane and ear canal normal.     Nose:     Right Sinus: No frontal sinus tenderness.     Left Sinus: No frontal sinus tenderness.     Mouth/Throat:     Mouth: Mucous membranes are moist.     Pharynx: Posterior oropharyngeal erythema present. No pharyngeal swelling, oropharyngeal exudate or uvula swelling.     Tonsils: No tonsillar exudate or tonsillar abscesses.  Cardiovascular:     Rate and Rhythm: Normal rate and regular rhythm.  Pulmonary:     Effort: Pulmonary effort is normal.     Breath sounds: Normal breath sounds.  Musculoskeletal:        General: Normal range of motion.  Lymphadenopathy:     Head:     Right side of head: No preauricular or posterior auricular adenopathy.  Left side of head: No preauricular or posterior auricular adenopathy.     Cervical: No cervical adenopathy.  Skin:    General: Skin is warm and dry.  Neurological:     Mental Status: She is alert.  Psychiatric:        Mood and Affect: Mood normal.        Behavior: Behavior normal.    BP 117/82 (BP Location: Left Arm, Patient Position: Sitting, Cuff Size: Normal)   Pulse 93   Temp 97.7 F (36.5 C) (Temporal)   Ht 4' 11 (1.499 m)   Wt 207 lb 3.2 oz (94 kg)   LMP 07/15/2024 (Approximate)   SpO2 99%   BMI 41.85 kg/m  Wt Readings from Last 3 Encounters:  08/05/24 207 lb 3.2 oz (94 kg)  06/11/24 208 lb 6.4 oz (94.5 kg)  05/28/24 203 lb 12.8 oz (92.4 kg)       Lucius Krabbe, NP

## 2024-09-24 ENCOUNTER — Encounter (HOSPITAL_BASED_OUTPATIENT_CLINIC_OR_DEPARTMENT_OTHER): Payer: Self-pay | Admitting: Internal Medicine

## 2024-10-14 ENCOUNTER — Ambulatory Visit: Admitting: Family

## 2024-10-24 ENCOUNTER — Ambulatory Visit: Admitting: Family

## 2024-10-27 ENCOUNTER — Encounter (INDEPENDENT_AMBULATORY_CARE_PROVIDER_SITE_OTHER): Payer: Self-pay

## 2024-10-27 ENCOUNTER — Ambulatory Visit: Admitting: Family

## 2024-10-27 ENCOUNTER — Encounter: Payer: Self-pay | Admitting: Family

## 2024-10-27 VITALS — BP 138/82 | HR 82 | Temp 97.0°F | Ht 59.0 in | Wt 208.2 lb

## 2024-10-27 DIAGNOSIS — Z833 Family history of diabetes mellitus: Secondary | ICD-10-CM

## 2024-10-27 DIAGNOSIS — R7303 Prediabetes: Secondary | ICD-10-CM

## 2024-10-27 DIAGNOSIS — I10 Essential (primary) hypertension: Secondary | ICD-10-CM | POA: Diagnosis not present

## 2024-10-27 LAB — HEMOGLOBIN A1C: Hgb A1c MFr Bld: 5.7 % (ref 4.6–6.5)

## 2024-10-27 NOTE — Progress Notes (Signed)
 "  Patient ID: Erika Christian, female    DOB: [birth year], 28 y.o.   MRN: [id number]  Chief Complaint  Patient presents with   Obesity   Dizziness    Pt c/o dizziness, present for a few months off and on.    Essential hypertension  Discussed the use of AI scribe software for clinical note transcription with the patient, who gave verbal consent to proceed.  History of Present Illness Erika Christian is a 28 year old female with hypertension who presents for follow-up regarding her blood pressure management and dizziness.  She experiences lightheadedness when she stands up quickly, but denies room spinning sensation. She was started on Amlodipine  2.5mg  qd by her cardiologist back in August. She checks her blood pressure at home, though not daily, and shares her readings today which are all in normal range, none above 130/90.  She denies vertigo-like symptoms.  She craves sweets at certain times of day and feels this contributes to weight fluctuations. She is working on weight control with daily 30-minute workouts and stepper exercise and asks for help to curb her appetite, especially for sweets and would like to recheck her A1C today.  She avoids caffeine because it triggers migraines that leave her unwell for several days. She focuses on drinking water and avoids high-sodium drinks.  Assessment & Plan Prediabetes and morbid obesity Borderline A1c level back in August of 5.8. Managed with lifestyle modifications and monitoring. Fluctuating weight and cravings for sweets. Discussed GLP-1 agonists, but noted high cost and lack of insurance coverage. Advised against stimulant-based weight loss pills due to side effects. - Recheck A1C today. - Encouraged dietary modifications focusing on increased fiber, low carb and high protein intake. - Encouraged regular exercise, including 30-minute workouts daily. - Referred to healthy weight and wellness clinic for comprehensive weight  management and education.  Hypertension Managed with amlodipine  2.5 mg at night. Reports lightheadedness; sx correlate with starting Amlodipine , reassured that mild sx are normal, allow extra time between lying, sitting and standing. Home readings are all good.  - Continue amlodipine  2.5 mg at night. - Encouraged regular blood pressure monitoring at home. - Advised on hydration (2.5L daily) and caution with sodium intake, particularly from V8 juice. - Continue to follow with Cardiology, or can call here if new/worsening symptoms   Subjective:    Outpatient Medications Prior to Visit  Medication Sig Dispense Refill   amLODipine  (NORVASC ) 2.5 MG tablet Take 1 tablet (2.5 mg total) by mouth daily. 90 tablet 3   No facility-administered medications prior to visit.   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  Constitutional:      Appearance: Normal appearance. She is morbidly obese.  Cardiovascular:     Rate and Rhythm: Normal rate and regular rhythm.  Pulmonary:     Effort: Pulmonary effort is normal.     Breath sounds: Normal breath sounds.  Musculoskeletal:        General: Normal range of motion.  Skin:    General: Skin is warm and dry.  Neurological:     Mental Status: She is alert.  Psychiatric:        Mood and Affect: Mood  normal.        Behavior: Behavior normal.    BP 138/82 (BP Location: Left Arm, Patient Position: Sitting)   Pulse 82   Temp (!) 97 F (36.1 C) (Temporal)   Ht 4' 11 (1.499 m)   Wt 208 lb 3.2 oz (94.4 kg)   LMP 10/15/2024 (Approximate)   SpO2 98%   BMI 42.05 kg/m  Wt Readings from Last 3 Encounters:  10/27/24 208 lb 3.2 oz (94.4 kg)  08/05/24 207 lb 3.2 oz (94 kg)   06/11/24 208 lb 6.4 oz (94.5 kg)      Jaqueline Uber, NP     [1]  Allergies Allergen Reactions   Orange Fruit [Citrus] Hives   Nifedipine      Migraines   Losartan      Spacey, fatigue   "

## 2024-10-28 ENCOUNTER — Ambulatory Visit: Payer: Self-pay | Admitting: Family

## 2024-11-02 ENCOUNTER — Ambulatory Visit (HOSPITAL_BASED_OUTPATIENT_CLINIC_OR_DEPARTMENT_OTHER): Attending: Nurse Practitioner | Admitting: Internal Medicine

## 2024-11-02 DIAGNOSIS — R0683 Snoring: Secondary | ICD-10-CM | POA: Diagnosis not present

## 2024-11-02 DIAGNOSIS — G4719 Other hypersomnia: Secondary | ICD-10-CM | POA: Insufficient documentation

## 2024-11-03 ENCOUNTER — Encounter: Payer: Self-pay | Admitting: Nurse Practitioner

## 2024-11-04 NOTE — Telephone Encounter (Signed)
 I don't have final results back from her in lab sleep study 12/28. Can take up to 2 weeks. Based on her BMI, she would qualify for GLP-1. In order to obtain Zepbound under OSA diagnosis, she has to have moderate to severe OSA, which we would need the sleep study for. She can discuss alteratives with her PCP in meantime. Thanks.

## 2024-11-08 ENCOUNTER — Telehealth: Payer: Self-pay | Admitting: Pulmonary Disease

## 2024-11-08 DIAGNOSIS — R0683 Snoring: Secondary | ICD-10-CM

## 2024-11-08 NOTE — Procedures (Signed)
 [name] [address] [place], [place] [address] Tel: [phone]   Fax: [phone]  Polysomnography Interpretation  Patient Name:  Erika Christian, Erika Christian Study Date:  11/02/2024 Referring Physician:  - %%startinterp%% Indications for Polysomnography The patient is a 29 year-old Female who is 4' 11 and weighs 208.0 lbs. Her BMI equals 42.5.  A full night polysomnogram was performed to evaluate for -.  Medication  No Data.   Polysomnogram Data A full night polysomnogram recorded the standard physiologic parameters including EEG, EOG, EMG, EKG, nasal and oral airflow.  Respiratory parameters of chest and abdominal movements were recorded with Respiratory Inductance Plethysmography belts.  Oxygen saturation was recorded by pulse oximetry.   Sleep Architecture The total recording time of the polysomnogram was 398.3 minutes.  The total sleep time was 282.5 minutes.  The patient spent 0.9% of total sleep time in Stage N1, 69.9% in Stage N2, 13.6% in Stages N3, and 15.6% in REM.  Sleep latency was 4.6 minutes.  REM latency was 59.0 minutes.  Sleep Efficiency was 70.9%.  Wake after Sleep Onset time was 110.5 minutes.  Respiratory Events The polysomnogram revealed a presence of - obstructive, 1 central, and - mixed apneas resulting in an Apnea index of 0.2 events per hour.  There were 13 hypopneas (>=3% desaturation and/or arousal) resulting in an Apnea\Hypopnea Index (AHI >=3% desaturation and/or arousal) of 3.0 events per hour.  There were 4 hypopneas (>=4% desaturation) resulting in an Apnea\Hypopnea Index (AHI >=4% desaturation) of 1.1 events per hour.  There were 2 Respiratory Effort Related Arousals resulting in a RERA index of 0.4 events per hour. The Respiratory Disturbance Index is 3.4 events per hour.  The snore index was 0.2 events per hour.  Mean oxygen saturation was 95.9%.  The lowest oxygen saturation during sleep was 90.0%.  Time spent <=88%  oxygen saturation was - minutes (-).  Limb Activity There were 7 total limb movements recorded, of this total, - were classified as PLMs.  PLM index was - per hour and PLM associated with Arousals index was - per hour.  Cardiac Summary The average pulse rate was 73.4 bpm.  The minimum pulse rate was 54.0 bpm while the maximum pulse rate was 111.0 bpm.  Cardiac rhythm was normal/abnormal.  Comments:  Patient had a diagnostic study performed Study was ended early as patient could not go back to sleep but about 4.6 hours of sleep was still obtained  Diagnosis:  Negative study for significant sleep disordered breathing AHI of 3.0 with O2 nadir of 90%.  There was no desaturations below 88% Sleep efficiency was fair with increased wake after sleep onset No periodic limb movement Cardiac rhythm was sinus Mild snoring noted Study similar to recent home sleep study that revealed a negative study with an AHI of 1.4   Recommendations: Clinical monitoring of symptoms Avoid alcohol, sedatives and other CNS depressants that may worsen sleep apnea and disrupt normal sleep architecture. Sleep hygiene should be reviewed to assess factors that may improve sleep quality. Weight management and regular exercise should be initiated or continued Encouraged side sleeping, elevation of the head of the bed by 20 to 30 degrees may help snoring Measures to treat snoring may also be explored  This study was personally reviewed and electronically signed by: Neda Hammond  MD  Accredited Board Certified in Sleep Medicine Date/Time:   11/08/24  %%endinterp%%   /Diagnostic PSG Report  Patient Name: Erika, Christian Study Date: 11/02/2024  Date  of Birth: Jun 06, 1996 Study Type: Diagnostic  Age: 29 year MRN #: [id number]  Sex: Female Interpreting Physician: NEYSA RAMA, [id number]  Height: 4' 11 Referring Physician: Cobb,Katherine  Np  Weight: 208.0 lbs Recording Tech: Jamee McConnico RPSGT RST  BMI:  42.5 Scoring Tech: Yvonne McConnico RPSGT RST  ESS: 2 Neck Size: 15   Study Overview  Lights Off: 09:49:37 PM  Count Index  Lights On: 04:27:56 AM Awakenings: 13 2.8  Time in Bed: 398.3 min. Arousals: 10 2.1  Total Sleep Time: 282.5 min. AHI (>=3% Desat and/or Ar.): 14 3.0   Sleep Efficiency: 70.9% AHI (>=4% Desat): 5 1.1   Sleep Latency: 4.6 min. Limb Movements: 7 1.5  Wake After Sleep Onset: 110.5 min. Snore: 1 0.2  REM Latency from Sleep Onset: 59.0 min. Desaturations: 55 11.7     Minimum SpO2 TST: 90.0%    Sleep Architecture  % of Time in Bed Stages Time (mins) % Sleep Time  Wake 115.5   Stage N1 2.5 0.9%  Stage N2 197.5 69.9%  Stage N3 38.5 13.6%  REM 44.0 15.6%   Arousal Summary   NREM REM Sleep Index  Respiratory Arousals 5 - 5 1.1  PLM Arousals - - - -  Isolated Limb Movement Arousals 1 - 1 0.2  Snore Arousals - - - -  Spontaneous Arousals 4 - 4 0.8  Total 10 - 10 2.1   Limb Movement Summary   Count Index  Isolated Limb Movements 7 1.5  Periodic Limb Movements (PLMs) - -  Total Limb Movements 7 1.5    Respiratory Summary   By Sleep Stage By Body Position Total   NREM REM Supine Non-Supine   Time (min) 238.5 44.0 55.0 227.5 282.5         Obstructive Apnea - - - - -  Mixed Apnea - - - - -  Central Apnea 1 - - 1 1  Total Apneas 1 - - 1 1  Total Apnea Index 0.3 - - 0.3 0.2         Hypopneas (>=3% Desat and/or Ar.) 11 2 3 10 13   AHI (>=3% Desat and/or Ar.) 3.0 2.7 3.3 2.9 3.0         Hypopneas (>=4% Desat) 4 - 1 3 4   AHI (>=4% Desat) 1.3 - 1.1 1.1 1.1          RERAs 2 - 1 1 2   RERA Index 0.5 - 1.1 0.3 0.4         RDI 3.5 2.7 4.4 3.2 3.4    Respiratory Event Type Index  Central Apneas 0.2  Obstructive Apneas -  Mixed Apneas -  Central Hypopneas -  Obstructive Hypopneas -  Central Apnea + Hypopnea (CAHI) 0.2  Obstructive Apnea + Hypopnea (OAHI) 2.8   Respiratory Event Durations   Apnea Hypopnea   NREM REM NREM REM  Average (seconds) 3.0 -  18.9 23.6  Maximum (seconds) 3.0 - 27.7 25.4    Oxygen Saturation Summary   Wake NREM REM TST TIB  Average SpO2 (%) 96.2% 95.5% 96.6% 95.7% 95.9%  Minimum SpO2 (%) 91.0% 90.0% 92.0% 90.0% 90.0%  Maximum SpO2 (%) 100.0% 100.0% 99.0% 100.0% 100.0%   Oxygen Saturation Distribution  Range (%) Time in range (min) Time in range (%)  90.0 - 100.0 398.9 100.0%  80.0 - 90.0 0.1 0.0%  70.0 - 80.0 - -  60.0 - 70.0 - -  50.0 - 60.0 - -  0.0 - 50.0 - -  Time Spent <=88% SpO2  Range (%) Time in range (min) Time in range (%)  0.0 - 88.0 - -      Count Index  Desaturations 55 11.7    Cardiac Summary   Wake NREM REM Sleep Total  Average Pulse Rate (BPM) 75.5 73.1 69.0 72.5 73.4  Minimum Pulse Rate (BPM) 58.0 54.0 55.0 54.0 54.0  Maximum Pulse Rate (BPM) 111.0 94.0 96.0 96.0 111.0   Pulse Rate Distribution:  Range (bpm) Time in range (min) Time in range (%)  0.0 - 40.0 - -  40.0 - 60.0 19.5 4.9%  60.0 - 80.0 339.4 85.1%  80.0 - 100.0 38.9 9.7%  100.0 - 120.0 1.3 0.3%  120.0 - 140.0 - -  140.0 - 200.0 - -      Hypnograms                      Technologist Comments  Very nice young lady arrived for her diagnostic study.  She did not take any meds while in the lab. She went to sleep easily and slept supine and left side. Even when pt says she is on her side:its really just her head that is on her side.  Her body is horizontal.  I may call some of that supine but I will check her body to make sure it is the same as when I was in the room earlier.  Her snores have been very light and I have not observed any PLMS with or without arousals nor have I observed any cardiac abnormalities so far. I have called a few events but not many. Pt has started to snore alittle but its not very loud.  Pt woke up and wires were pulled loose.  TIR to replace. She has not been able to go back to sleep since that happened. She just told me that she does not think she can get back to  sleep. I will turn the study off .

## 2024-11-08 NOTE — Telephone Encounter (Signed)
 Call patient  Sleep study result  Date of study: 10/25/2024  Impression: Negative study for significant sleep disordered breathing with an AHI of 3.0, no significant oxygen desaturations Similar to recent home sleep study with an AHI of 1.4  Recommendation:  Clinical monitoring of symptoms Avoid alcohol, sedatives and other CNS depressants that may worsen sleep apnea and disrupt normal sleep architecture. Sleep hygiene should be reviewed to assess factors that may improve sleep quality. Weight management and regular exercise should be initiated or continued Encouraged side sleeping, elevation of the head of the bed by 20 to 30 degrees may help snoring Measures to treat snoring may also be explored

## 2024-11-10 NOTE — Telephone Encounter (Signed)
 No OSA and did not require any supplemental oxygen during study. Encourage her to work on healthy weight loss measures with PCP. She can follow up with us  as needed. Thanks.

## 2024-11-10 NOTE — Telephone Encounter (Signed)
ATC x 1 

## 2024-11-12 ENCOUNTER — Telehealth: Payer: Self-pay

## 2024-11-12 NOTE — Telephone Encounter (Signed)
" °  Spoke with patient VBU,  will f/u with PCP- over GPL     Copied from CRM #[id number]. Topic: Clinical - Lab/Test Results >> Nov 10, 2024  3:11 PM Russell PARAS wrote: Reason for CRM:   Pt returning call from [place], concerning her sleep study results. Requested call back   CB#  [phone] "

## 2024-11-14 ENCOUNTER — Encounter: Payer: Self-pay | Admitting: Family

## 2024-11-17 ENCOUNTER — Other Ambulatory Visit: Payer: Self-pay

## 2024-11-17 ENCOUNTER — Telehealth: Payer: Self-pay

## 2024-11-17 ENCOUNTER — Other Ambulatory Visit (HOSPITAL_COMMUNITY): Payer: Self-pay

## 2024-11-17 DIAGNOSIS — E669 Obesity, unspecified: Secondary | ICD-10-CM

## 2024-11-17 DIAGNOSIS — Z833 Family history of diabetes mellitus: Secondary | ICD-10-CM

## 2024-11-17 DIAGNOSIS — R7303 Prediabetes: Secondary | ICD-10-CM

## 2024-11-17 MED ORDER — WEGOVY 0.25 MG/0.5ML ~~LOC~~ SOAJ: 0.2500 mg | SUBCUTANEOUS | 0 refills | Status: AC

## 2024-11-17 NOTE — Telephone Encounter (Signed)
 Please start PA for patient on wegovy  0.25mg .  Thanks!

## 2024-11-17 NOTE — Telephone Encounter (Signed)
 let her know medicaid has said they will cover the GLP-1 meds again for weight loss, but may take time on approval - ok to send wegovy  0.25mg  weekly with 0 RF , & send msg to PA team - she needs OV in 1 month after starting. Thx

## 2024-11-17 NOTE — Telephone Encounter (Signed)
 Pharmacy Patient Advocate Encounter   Received notification from Patient Advice Request messages that prior authorization for Wegovy  0.25mg /0.41ml is required/requested.   Insurance verification completed.   The patient is insured through [employer].   Per test claim: PA required; PA submitted to above mentioned insurance via Latent Key/confirmation #/EOC [id number] Status is pending

## 2024-11-18 NOTE — Telephone Encounter (Signed)
 Pharmacy Patient Advocate Encounter  Received notification from [employer] that Prior Authorization for Wegovy  0.25mg /0.34ml has been APPROVED from 11/17/24 to 05/16/25   PA #/Case ID/Reference #: [id number]

## 2024-11-25 ENCOUNTER — Other Ambulatory Visit: Payer: Self-pay | Admitting: Family

## 2024-11-25 DIAGNOSIS — Z833 Family history of diabetes mellitus: Secondary | ICD-10-CM

## 2024-11-25 DIAGNOSIS — E669 Obesity, unspecified: Secondary | ICD-10-CM

## 2024-11-25 DIAGNOSIS — R7303 Prediabetes: Secondary | ICD-10-CM

## 2024-11-26 NOTE — Telephone Encounter (Signed)
 I believe medicaid is only covering wegovy , she needs to call the number on her card and ask if they also will cover zepbound as it is more expensive.

## 2024-12-10 ENCOUNTER — Other Ambulatory Visit (HOSPITAL_COMMUNITY): Payer: Self-pay
